# Patient Record
Sex: Male | Born: 1990 | Race: Black or African American | Hispanic: No | Marital: Single | Smoking: Former smoker
Health system: Southern US, Community
[De-identification: ages and names within clinical notes are randomized; demographics above are authoritative.]

## PROBLEM LIST (undated history)

## (undated) DIAGNOSIS — K219 Gastro-esophageal reflux disease without esophagitis: Secondary | ICD-10-CM

## (undated) HISTORY — PX: ESOPHAGOGASTRODUODENOSCOPY: SHX1529

---

## 2016-08-14 DIAGNOSIS — Z7251 High risk heterosexual behavior: Secondary | ICD-10-CM | POA: Insufficient documentation

## 2016-09-17 DIAGNOSIS — M25519 Pain in unspecified shoulder: Secondary | ICD-10-CM | POA: Insufficient documentation

## 2018-12-04 ENCOUNTER — Encounter (HOSPITAL_COMMUNITY): Payer: Self-pay | Admitting: Emergency Medicine

## 2018-12-04 ENCOUNTER — Emergency Department (HOSPITAL_COMMUNITY)
Admission: EM | Admit: 2018-12-04 | Discharge: 2018-12-04 | Disposition: A | Discharge status: Home / Self Care | Payer: Self-pay | Attending: Emergency Medicine | Admitting: Emergency Medicine

## 2018-12-04 ENCOUNTER — Emergency Department (HOSPITAL_COMMUNITY): Payer: Self-pay

## 2018-12-04 ENCOUNTER — Other Ambulatory Visit: Payer: Self-pay

## 2018-12-04 DIAGNOSIS — R0789 Other chest pain: Secondary | ICD-10-CM | POA: Insufficient documentation

## 2018-12-04 DIAGNOSIS — R0781 Pleurodynia: Secondary | ICD-10-CM

## 2018-12-04 DIAGNOSIS — F17210 Nicotine dependence, cigarettes, uncomplicated: Secondary | ICD-10-CM | POA: Insufficient documentation

## 2018-12-04 IMAGING — CR DG CHEST 2V
2 series · 2 of 2 positions shown · non-contrast
Comparison: None.

CLINICAL DATA: Left mid/lower rib pain for 3 days.

EXAM:
CHEST - 2 VIEW

[chest pa]
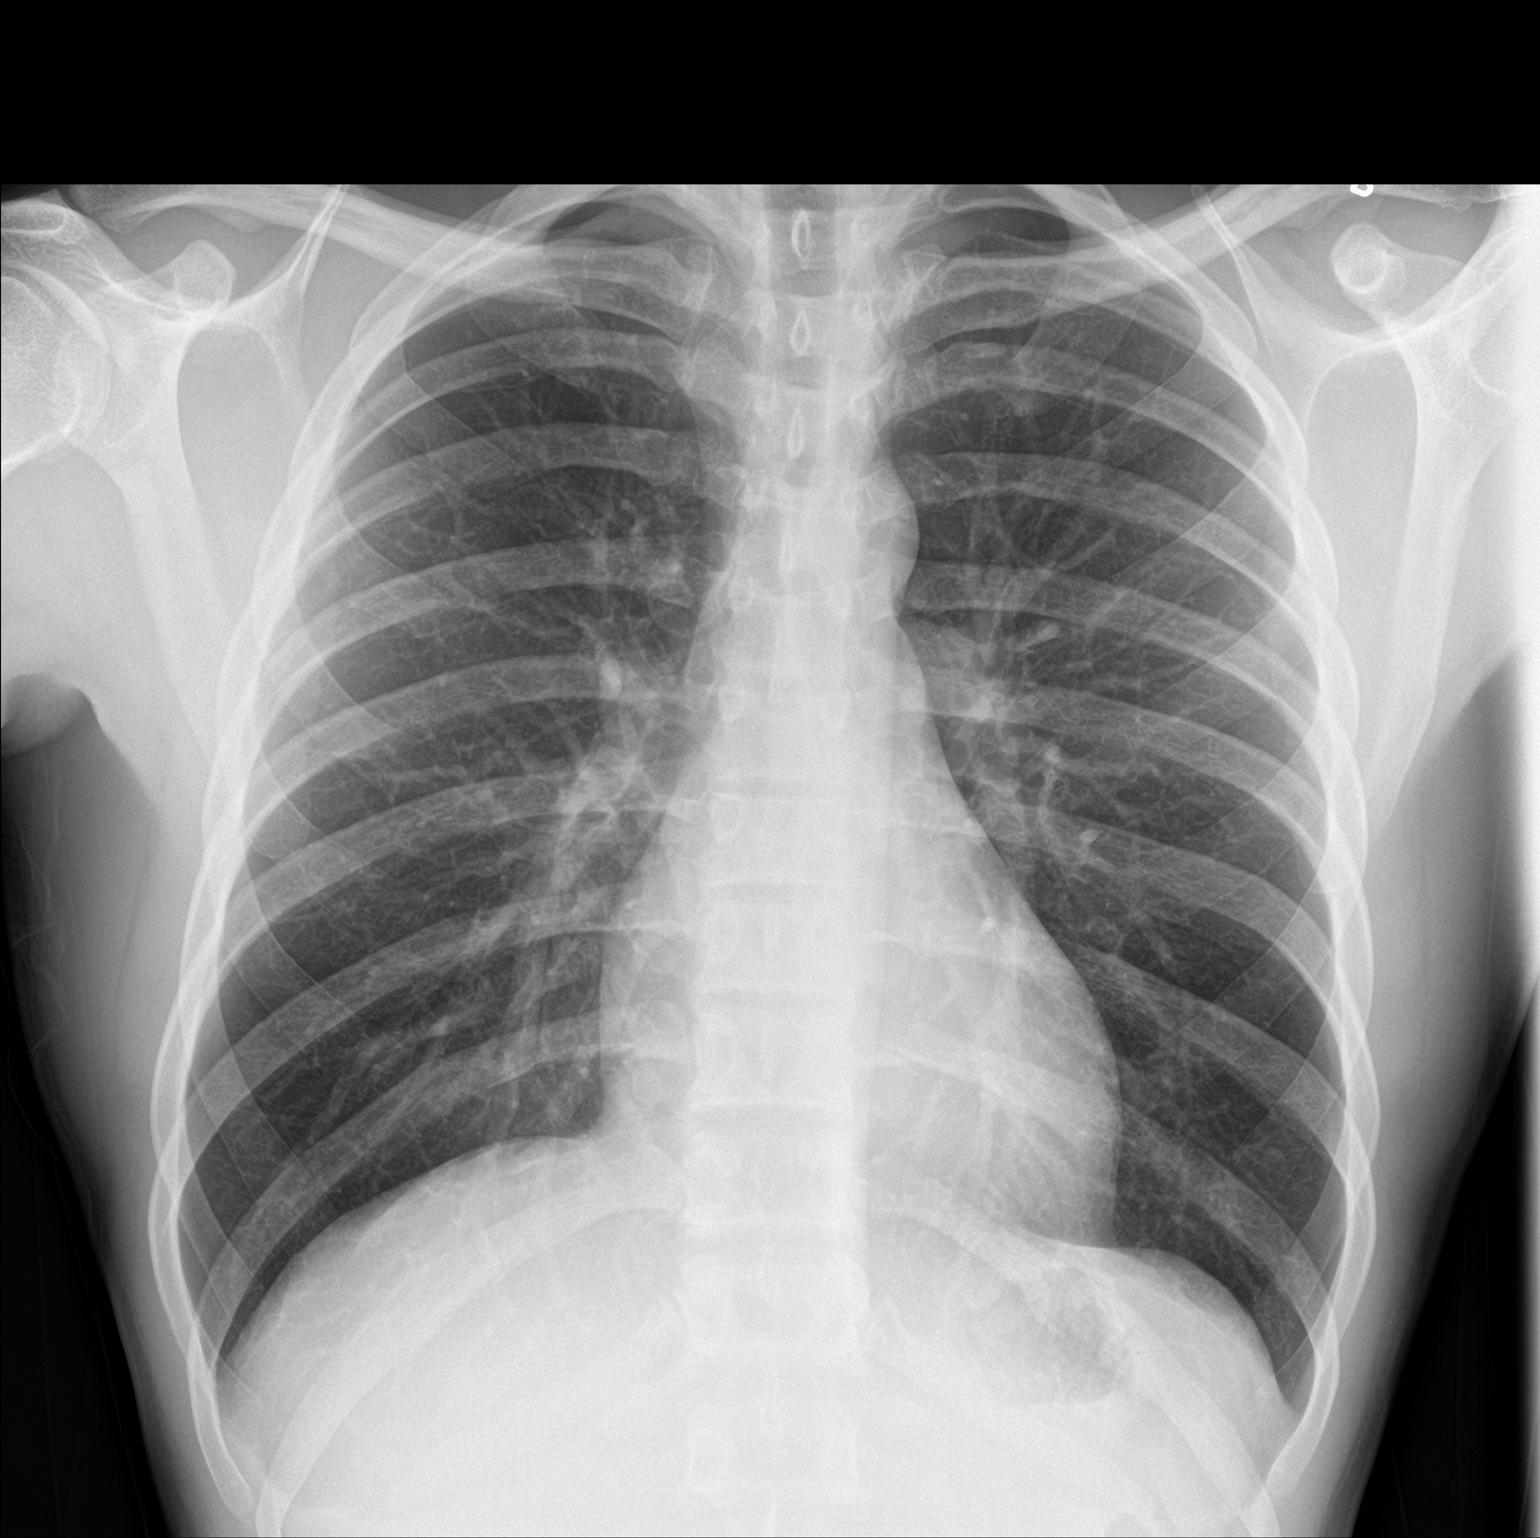

[chest lat]
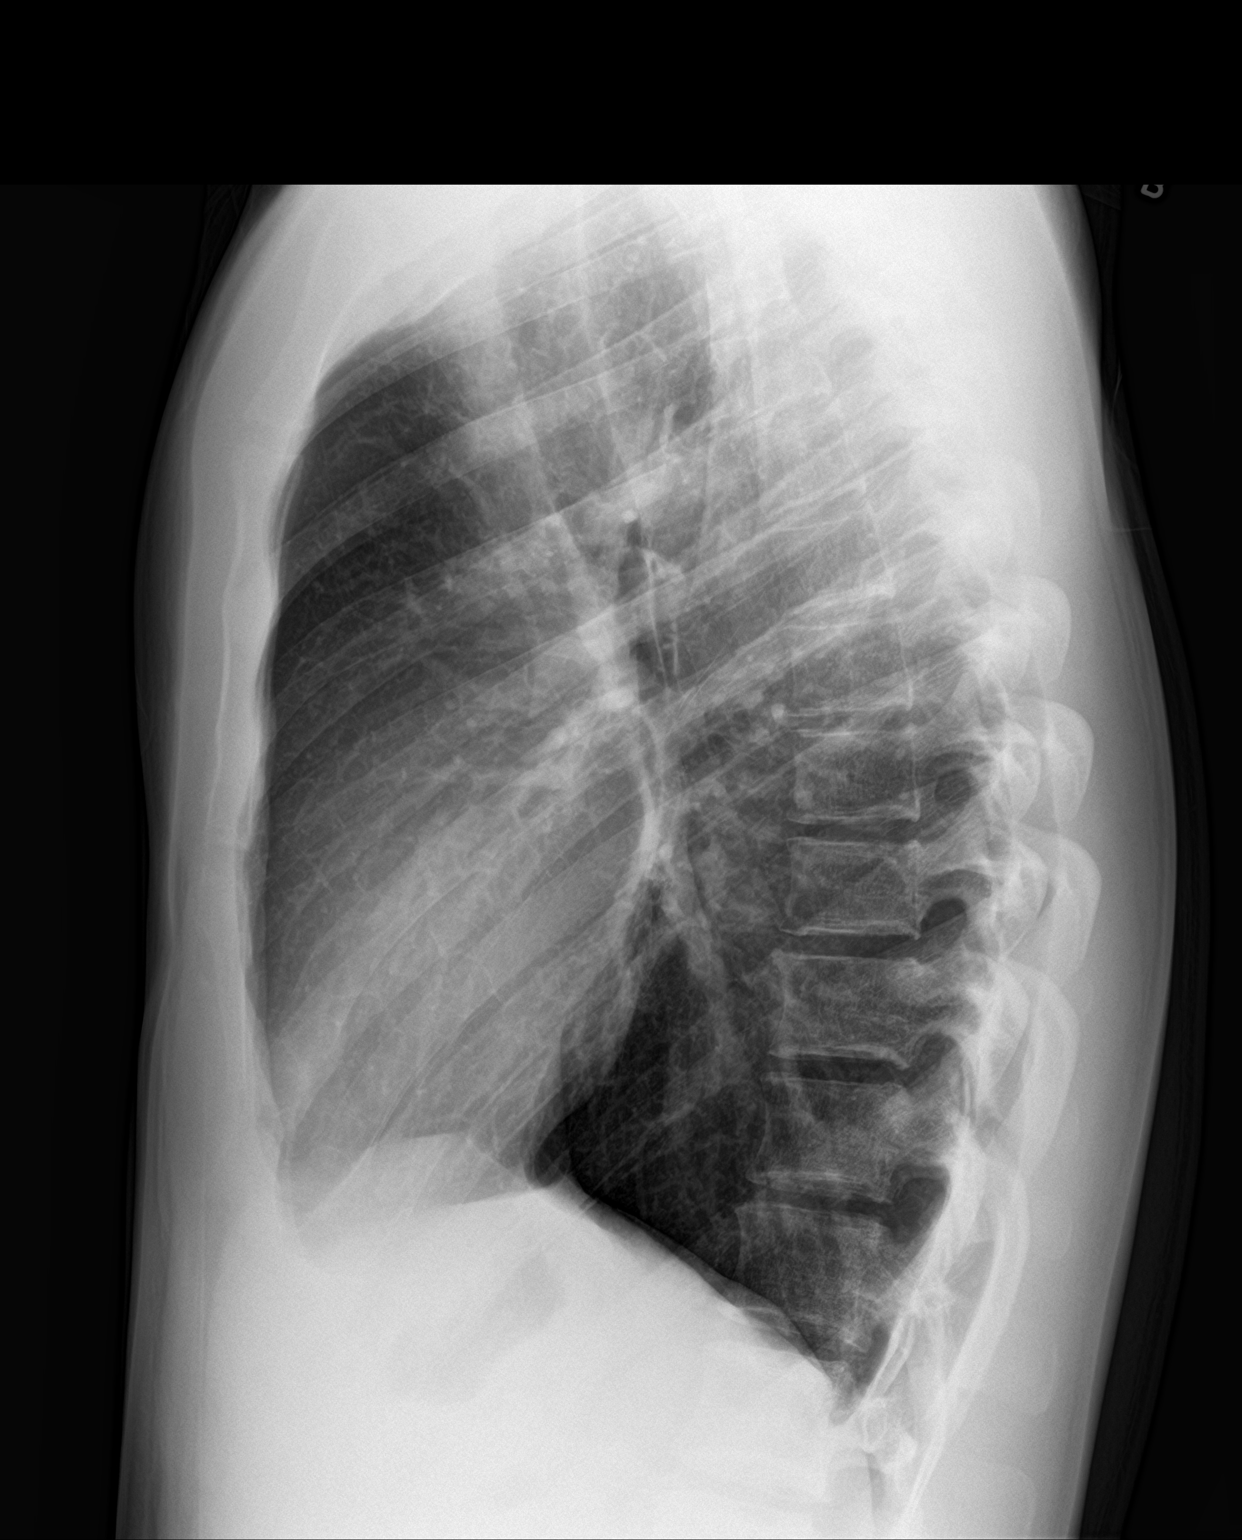

[2 of 2 positions shown; findings below may reference images not displayed]

FINDINGS: The cardiomediastinal silhouette is within normal limits. The lungs
are well inflated and clear. There is no evidence of pleural
effusion or pneumothorax. No acute osseous abnormality is
identified.
IMPRESSION: No active cardiopulmonary disease.

## 2018-12-04 NOTE — ED Notes (Signed)
Patient transported to X-ray 

## 2018-12-04 NOTE — ED Triage Notes (Signed)
Pt reports lower rib cage pain on the left side. Pt reports same hurts when he breathes or touches same.

## 2018-12-04 NOTE — Discharge Instructions (Signed)
You may take over-the-counter Tylenol or Motrin.  You can apply gentle heat or ice intermittantly. Thank you for allowing me to care for you today. Please return to the emergency department if you have new or worsening symptoms. Take your medications as instructed.

## 2018-12-04 NOTE — ED Notes (Signed)
Pt A&ox4, ambulatory at d/c with independent steady gait, NAD. Verbalized understanding of d/c instructions and follow up care

## 2018-12-04 NOTE — ED Provider Notes (Signed)
[facility] EMERGENCY DEPARTMENT Provider Note   CSN: [id number] Arrival date & time: 12/04/18  4287     History   Chief Complaint Chief Complaint  Patient presents with  . Rib Cage Pain    HPI Vincenzo Stave is a 28 y.o. male.     Patient is a 28 year old male with no past medical history presenting to the emergency department for rib cage pain.  He reports "it hurts in my ribs like I might have a fracture or broke a rib or something".  Reports that this started about 3 days ago.  Denies any injury or trauma.  No history of the same in the past.  Pain is worse with palpation and with coughing.  Denies any shortness of breath, fever, chills, chest pain     History reviewed. No pertinent past medical history.  There are no active problems to display for this patient.   History reviewed. No pertinent surgical history.      Home Medications    Prior to Admission medications   Not on File    Family History No family history on file.  Social History Social History   Tobacco Use  . Smoking status: Current Some Day Smoker  . Smokeless tobacco: Never Used  Substance Use Topics  . Alcohol use: Never    Frequency: Never  . Drug use: Never     Allergies   Patient has no known allergies.   Review of Systems Review of Systems  Constitutional: Negative for chills and fever.  HENT: Negative for congestion, rhinorrhea and sore throat.   Respiratory: Negative for cough and shortness of breath.   Cardiovascular: Negative for chest pain.  Gastrointestinal: Negative for abdominal distention, abdominal pain, nausea and vomiting.  Genitourinary: Negative for flank pain.  Musculoskeletal: Positive for arthralgias. Negative for myalgias.  Skin: Negative for rash and wound.  Neurological: Negative for dizziness, light-headedness and numbness.     Physical Exam Updated Vital Signs BP 129/82 (BP Location: Right Arm)   Pulse (!) 52   Temp 98.4 F  (36.9 C) (Oral)   Resp 18   Ht 6\' 3"  (1.905 m)   Wt 79.4 kg   SpO2 98%   BMI 21.87 kg/m   Physical Exam Vitals signs and nursing note reviewed.  Constitutional:      Appearance: Normal appearance. He is well-developed.  HENT:     Head: Normocephalic and atraumatic.  Eyes:     Conjunctiva/sclera: Conjunctivae normal.  Neck:     Musculoskeletal: Neck supple.  Cardiovascular:     Rate and Rhythm: Normal rate and regular rhythm.     Heart sounds: No murmur.  Pulmonary:     Effort: Pulmonary effort is normal. No respiratory distress.     Breath sounds: Normal breath sounds.  Chest:    Abdominal:     Palpations: Abdomen is soft.     Tenderness: There is no abdominal tenderness.  Skin:    General: Skin is warm and dry.  Neurological:     Mental Status: He is alert.      ED Treatments / Results  Labs (all labs ordered are listed, but only abnormal results are displayed) Labs Reviewed - No data to display  EKG None  Radiology Dg Chest 2 View  Result Date: 12/04/2018 CLINICAL DATA:  Left mid/lower rib pain for 3 days. EXAM: CHEST - 2 VIEW COMPARISON:  None. FINDINGS: The cardiomediastinal silhouette is within normal limits. The lungs are well inflated and  clear. There is no evidence of pleural effusion or pneumothorax. No acute osseous abnormality is identified. IMPRESSION: No active cardiopulmonary disease. Electronically Signed   By: Logan Bores M.D.   On: 12/04/2018 10:42    Procedures Procedures (including critical care time)  Medications Ordered in ED Medications - No data to display   Initial Impression / Assessment and Plan / ED Course  I have reviewed the triage vital signs and the nursing notes.  Pertinent labs & imaging results that were available during my care of the patient were reviewed by me and considered in my medical decision making (see chart for details).  Clinical Course as of Dec 03 1049  Fri Dec 04, 2018  1049 Patient with  musculoskeletal tenderness to the anterior rib cage.  Chest x-ray normal.  Likely musculoskeletal strain.  Patient declined any pain medication.   [KM]    Clinical Course User Index [KM] Alveria Apley, PA-C       Based on review of vitals, medical screening exam, lab work and/or imaging, there does not appear to be an acute, emergent etiology for the patient's symptoms. Counseled pt on good return precautions and encouraged both PCP and ED follow-up as needed.  Prior to discharge, I also discussed incidental imaging findings with patient in detail and advised appropriate, recommended follow-up in detail.  Clinical Impression: 1. Rib pain     Disposition: Discharge  Prior to providing a prescription for a controlled substance, I independently reviewed the patient's recent prescription history on the [place]. The patient had no recent or regular prescriptions and was deemed appropriate for a brief, less than 3 day prescription of narcotic for acute analgesia.  This note was prepared with assistance of Systems analyst. Occasional wrong-word or sound-a-like substitutions may have occurred due to the inherent limitations of voice recognition software.   Final Clinical Impressions(s) / ED Diagnoses   Final diagnoses:  Rib pain    ED Discharge Orders    None       Kristine Royal 12/04/18 1051    Virgel Manifold, MD 12/05/18 1216

## 2019-03-26 ENCOUNTER — Other Ambulatory Visit: Payer: Self-pay

## 2019-03-26 ENCOUNTER — Encounter (HOSPITAL_COMMUNITY): Payer: Self-pay | Admitting: *Deleted

## 2019-03-26 ENCOUNTER — Emergency Department (HOSPITAL_COMMUNITY)
Admission: EM | Admit: 2019-03-26 | Discharge: 2019-03-26 | Disposition: A | Discharge status: Home / Self Care | Payer: Self-pay | Attending: Emergency Medicine | Admitting: Emergency Medicine

## 2019-03-26 DIAGNOSIS — R112 Nausea with vomiting, unspecified: Secondary | ICD-10-CM | POA: Insufficient documentation

## 2019-03-26 DIAGNOSIS — Z87891 Personal history of nicotine dependence: Secondary | ICD-10-CM | POA: Insufficient documentation

## 2019-03-26 DIAGNOSIS — T407X1A Poisoning by cannabis (derivatives), accidental (unintentional), initial encounter: Secondary | ICD-10-CM | POA: Insufficient documentation

## 2019-03-26 LAB — COMPREHENSIVE METABOLIC PANEL
ALT: 25 U/L (ref 0–44)
AST: 23 U/L (ref 15–41)
Albumin: 5 g/dL (ref 3.5–5.0)
Alkaline Phosphatase: 50 U/L (ref 38–126)
Anion gap: 12 (ref 5–15)
BUN: 8 mg/dL (ref 6–20)
CO2: 25 mmol/L (ref 22–32)
Calcium: 10.3 mg/dL (ref 8.9–10.3)
Chloride: 104 mmol/L (ref 98–111)
Creatinine, Ser: 0.99 mg/dL (ref 0.61–1.24)
GFR calc Af Amer: >60 mL/min (ref >60)
GFR calc non Af Amer: >60 mL/min (ref >60)
Glucose, Bld: 104 mg/dL — ABNORMAL HIGH (ref 70–99)
Potassium: 3.8 mmol/L (ref 3.5–5.1)
Sodium: 141 mmol/L (ref 135–145)
Total Bilirubin: 1.4 mg/dL — ABNORMAL HIGH (ref 0.3–1.2)
Total Protein: 7.9 g/dL (ref 6.5–8.1)

## 2019-03-26 LAB — CBC
HCT: 45.5 % (ref 39.0–52.0)
Hemoglobin: 15.2 g/dL (ref 13.0–17.0)
MCH: 30.3 pg (ref 26.0–34.0)
MCHC: 33.4 g/dL (ref 30.0–36.0)
MCV: 90.6 fL (ref 80.0–100.0)
Platelets: 273 10*3/uL (ref 150–400)
RBC: 5.02 MIL/uL (ref 4.22–5.81)
RDW: 13.6 % (ref 11.5–15.5)
WBC: 10.5 10*3/uL (ref 4.0–10.5)
nRBC: 0 % (ref 0.0–0.2)

## 2019-03-26 LAB — URINALYSIS, ROUTINE W REFLEX MICROSCOPIC
Bilirubin Urine: NEGATIVE
Glucose, UA: NEGATIVE mg/dL
Hgb urine dipstick: NEGATIVE
Ketones, ur: NEGATIVE mg/dL
Leukocytes,Ua: NEGATIVE
Nitrite: NEGATIVE
Protein, ur: NEGATIVE mg/dL
Specific Gravity, Urine: 1.017 (ref 1.005–1.030)
pH: 9 — ABNORMAL HIGH (ref 5.0–8.0)

## 2019-03-26 LAB — LIPASE, BLOOD: Lipase: 21 U/L (ref 11–51)

## 2019-03-26 MED ORDER — SODIUM CHLORIDE 0.9% FLUSH: 3.0000 mL | Freq: Once | INTRAVENOUS | Status: DC

## 2019-03-26 MED ORDER — ONDANSETRON HCL 4 MG/2ML IJ SOLN
4.0000 mg | Freq: Once | INTRAMUSCULAR | Status: AC
  Administered 2019-03-26: 4 mg via INTRAVENOUS
  Filled 2019-03-26: qty 2

## 2019-03-26 MED ORDER — ONDANSETRON 4 MG PO TBDP: 4.0000 mg | ORAL_TABLET | Freq: Three times a day (TID) | ORAL | 0 refills | Status: DC | PRN

## 2019-03-26 NOTE — ED Provider Notes (Signed)
This patient is a pleasant 29 year old male who is otherwise healthy, endorses using marijuana daily states that he only smokes in the mornings, he also reports that he has had nausea and abdominal discomfort almost every morning for the last 2 weeks.  Occasionally complains of some dark black stools but this is not regularly and denies any significant alcohol or anti-inflammatory use.  He denies having any dysuria, hematuria coughing or chest pain but sometimes states that when he gets nauseated his chest goes numb.  On my exam he has a soft nontender abdomen, benign exam, well-appearing, normal vital signs.  Labs ordered however I suspect hyperemesis from cannabinoid use, patient informed that he needs to stop using this and is agreeable.  No indication for advanced imaging at this time.  Medical screening examination/treatment/procedure(s) were conducted as a shared visit with non-physician practitioner(s) and myself.  I personally evaluated the patient during the encounter.  Clinical Impression:   Final diagnoses:  Cannabinoid hyperemesis syndrome         Eber Hong, MD 03/26/19 1932

## 2019-03-26 NOTE — ED Provider Notes (Signed)
[place] EMERGENCY DEPARTMENT Provider Note   CSN: [id number] Arrival date & time: 03/26/19  0813     History Chief Complaint  Patient presents with  . Abdominal Pain    Justin Ferrell is a 29 y.o. male who presents with nausea and vomiting.  Patient states that for the past 10 days he has had recurrent nausea and vomiting with abdominal discomfort early in the morning.  He states that he will vomit 4-5 times which will be bilious in nature.  He will also have a bowel movement in the morning but states that it is not diarrhea although will be dark at times.  He denies severe abdominal pain but describes some periumbilical abdominal discomfort.  Sometimes he feels like his abdominal discomfort radiates up to his chest and it will cause chest numbness and hand numbness.  Symptoms improved throughout the day until the next morning. They also improve with hot showers.  He denies fever, chills, shortness of breath, cough, bloody stools, urinary symptoms.  He denies significant alcohol or NSAID use.  He does endorse daily marijuana use for years. No prior abdominal surgeries.  HPI     History reviewed. No pertinent past medical history.  There are no problems to display for this patient.   History reviewed. No pertinent surgical history.     No family history on file.  Social History   Tobacco Use  . Smoking status: Former Research scientist (life sciences)  . Smokeless tobacco: Never Used  Substance Use Topics  . Alcohol use: Never  . Drug use: Yes    Types: Marijuana    Home Medications Prior to Admission medications   Not on File    Allergies    Patient has no known allergies.  Review of Systems   Review of Systems  Constitutional: Negative for chills and fever.  Respiratory: Negative for cough and shortness of breath.   Cardiovascular: Negative for chest pain.  Gastrointestinal: Positive for abdominal pain, nausea and vomiting. Negative for blood in stool and diarrhea.    Genitourinary: Negative for difficulty urinating, dysuria, frequency and hematuria.  Neurological: Positive for numbness.  All other systems reviewed and are negative.   Physical Exam Updated Vital Signs BP (!) 143/84   Pulse (!) 57   Temp 97.7 F (36.5 C) (Oral)   Resp 18   Ht 6\' 3"  (1.905 m)   Wt 74.8 kg   SpO2 100%   BMI 20.62 kg/m   Physical Exam Vitals and nursing note reviewed.  Constitutional:      General: He is not in acute distress.    Appearance: He is well-developed. He is not ill-appearing.     Comments: Calm, cooperative. NAD. Fatigued but overall well appearing  HENT:     Head: Normocephalic and atraumatic.  Eyes:     General: No scleral icterus.       Right eye: No discharge.        Left eye: No discharge.     Conjunctiva/sclera: Conjunctivae normal.     Pupils: Pupils are equal, round, and reactive to light.  Cardiovascular:     Rate and Rhythm: Regular rhythm. Bradycardia present.  Pulmonary:     Effort: Pulmonary effort is normal. No respiratory distress.     Breath sounds: Normal breath sounds.  Abdominal:     General: Abdomen is flat. Bowel sounds are normal. There is no distension.     Palpations: Abdomen is soft.     Tenderness: There is no abdominal  tenderness.  Musculoskeletal:     Cervical back: Normal range of motion.  Skin:    General: Skin is warm and dry.  Neurological:     Mental Status: He is alert and oriented to person, place, and time.  Psychiatric:        Behavior: Behavior normal.     ED Results / Procedures / Treatments   Labs (all labs ordered are listed, but only abnormal results are displayed) Labs Reviewed  COMPREHENSIVE METABOLIC PANEL - Abnormal; Notable for the following components:      Result Value   Glucose, Bld 104 (*)    Total Bilirubin 1.4 (*)    All other components within normal limits  URINALYSIS, ROUTINE W REFLEX MICROSCOPIC - Abnormal; Notable for the following components:   pH 9.0 (*)    All  other components within normal limits  LIPASE, BLOOD  CBC    EKG None  Radiology No results found.  Procedures Procedures (including critical care time)  Medications Ordered in ED Medications  sodium chloride flush (NS) 0.9 % injection 3 mL (has no administration in time range)  ondansetron (ZOFRAN) injection 4 mg (4 mg Intravenous Given 03/26/19 0258)    ED Course  I have reviewed the triage vital signs and the nursing notes.  Pertinent labs & imaging results that were available during my care of the patient were reviewed by me and considered in my medical decision making (see chart for details).  29 year old male with periumbilical abdominal discomfort and recurrent nausea and vomiting every morning for the past 10 days.  He endorses regular marijuana use.  He is bradycardic but otherwise vitals are reassuring.  Abdomen is soft and nontender.  High suspicion for cannabinoid hyperemesis syndrome.  Patient was counseled on marijuana cessation and verbalizes understanding.  Will obtain labs, UA.  Shared visit with Dr. Hyacinth Meeker.  CBC is normal.  CMP is overall unremarkable.  Bilirubin is minimally elevated to 1.48.  He has no abdominal tenderness and there is low suspicion for biliary pathology.  UA is reassuring.  Discussed results with the patient.  He feels a little bit better after Zofran but not completely.  Will attempt p.o. challenge.  Pt vomited after PO challenge but he is requesting d/c. Will rx Zofran. Pt was strongly encouraged to abstain from marijuana use.  MDM Rules/Calculators/A&P                       Final Clinical Impression(s) / ED Diagnoses Final diagnoses:  Cannabinoid hyperemesis syndrome    Rx / DC Orders ED Discharge Orders    None       Bethel Born, PA-C 03/26/19 1148    Eber Hong, MD 03/26/19 [id number]

## 2019-03-26 NOTE — Discharge Instructions (Signed)
Take Zofran as needed for nausea/vomiting Please avoid marijuana use as this will make your symptoms flare up again Return if you are worsening

## 2019-03-26 NOTE — ED Notes (Signed)
Patient Alert and oriented to baseline. Stable and ambulatory to baseline. Patient verbalized understanding of the discharge instructions.  Patient belongings were taken by the patient.   

## 2019-03-26 NOTE — ED Triage Notes (Signed)
C/o waking up with abd. Pain and vomits x 2 weeks

## 2019-03-26 NOTE — ED Notes (Signed)
Heat pack provided.  

## 2019-03-26 NOTE — ED Notes (Signed)
Pt provided with crackers and gingerale

## 2019-06-20 ENCOUNTER — Other Ambulatory Visit: Payer: Self-pay

## 2019-06-20 ENCOUNTER — Encounter (HOSPITAL_BASED_OUTPATIENT_CLINIC_OR_DEPARTMENT_OTHER): Payer: Self-pay | Admitting: *Deleted

## 2019-06-20 ENCOUNTER — Emergency Department (HOSPITAL_COMMUNITY)
Admission: EM | Admit: 2019-06-20 | Discharge: 2019-06-20 | Disposition: A | Discharge status: Home / Self Care | Payer: Self-pay | Attending: Emergency Medicine | Admitting: Emergency Medicine

## 2019-06-20 ENCOUNTER — Emergency Department (HOSPITAL_BASED_OUTPATIENT_CLINIC_OR_DEPARTMENT_OTHER)
Admission: EM | Admit: 2019-06-20 | Discharge: 2019-06-20 | Disposition: A | Discharge status: Home / Self Care | Payer: Self-pay | Attending: Emergency Medicine | Admitting: Emergency Medicine

## 2019-06-20 ENCOUNTER — Encounter (HOSPITAL_COMMUNITY): Payer: Self-pay | Admitting: Emergency Medicine

## 2019-06-20 DIAGNOSIS — Z87891 Personal history of nicotine dependence: Secondary | ICD-10-CM | POA: Insufficient documentation

## 2019-06-20 DIAGNOSIS — Z79899 Other long term (current) drug therapy: Secondary | ICD-10-CM | POA: Insufficient documentation

## 2019-06-20 DIAGNOSIS — R112 Nausea with vomiting, unspecified: Secondary | ICD-10-CM | POA: Insufficient documentation

## 2019-06-20 DIAGNOSIS — R1084 Generalized abdominal pain: Secondary | ICD-10-CM | POA: Insufficient documentation

## 2019-06-20 DIAGNOSIS — R109 Unspecified abdominal pain: Secondary | ICD-10-CM | POA: Insufficient documentation

## 2019-06-20 DIAGNOSIS — Z5321 Procedure and treatment not carried out due to patient leaving prior to being seen by health care provider: Secondary | ICD-10-CM | POA: Insufficient documentation

## 2019-06-20 LAB — URINALYSIS, MICROSCOPIC (REFLEX): WBC, UA: NONE SEEN WBC/hpf (ref 0–5)

## 2019-06-20 LAB — LIPASE, BLOOD: Lipase: 21 U/L (ref 11–51)

## 2019-06-20 LAB — URINALYSIS, ROUTINE W REFLEX MICROSCOPIC
Bilirubin Urine: NEGATIVE
Glucose, UA: NEGATIVE mg/dL
Hgb urine dipstick: NEGATIVE
Ketones, ur: >80 mg/dL — AB
Leukocytes,Ua: NEGATIVE
Nitrite: NEGATIVE
Protein, ur: 30 mg/dL — AB
Specific Gravity, Urine: 1.02 (ref 1.005–1.030)
pH: >9 — ABNORMAL HIGH (ref 5.0–8.0)

## 2019-06-20 LAB — CBC
HCT: 45.3 % (ref 39.0–52.0)
Hemoglobin: 16.2 g/dL (ref 13.0–17.0)
MCH: 30.4 pg (ref 26.0–34.0)
MCHC: 35.8 g/dL (ref 30.0–36.0)
MCV: 85 fL (ref 80.0–100.0)
Platelets: 297 10*3/uL (ref 150–400)
RBC: 5.33 MIL/uL (ref 4.22–5.81)
RDW: 13.3 % (ref 11.5–15.5)
WBC: 13.9 10*3/uL — ABNORMAL HIGH (ref 4.0–10.5)
nRBC: 0 % (ref 0.0–0.2)

## 2019-06-20 LAB — COMPREHENSIVE METABOLIC PANEL
ALT: 25 U/L (ref 0–44)
AST: 32 U/L (ref 15–41)
Albumin: 5.4 g/dL — ABNORMAL HIGH (ref 3.5–5.0)
Alkaline Phosphatase: 50 U/L (ref 38–126)
Anion gap: 14 (ref 5–15)
BUN: 19 mg/dL (ref 6–20)
CO2: 19 mmol/L — ABNORMAL LOW (ref 22–32)
Calcium: 10.4 mg/dL — ABNORMAL HIGH (ref 8.9–10.3)
Chloride: 104 mmol/L (ref 98–111)
Creatinine, Ser: 0.91 mg/dL (ref 0.61–1.24)
GFR calc Af Amer: >60 mL/min (ref >60)
GFR calc non Af Amer: >60 mL/min (ref >60)
Glucose, Bld: 112 mg/dL — ABNORMAL HIGH (ref 70–99)
Potassium: 3.7 mmol/L (ref 3.5–5.1)
Sodium: 137 mmol/L (ref 135–145)
Total Bilirubin: 2.4 mg/dL — ABNORMAL HIGH (ref 0.3–1.2)
Total Protein: 8.6 g/dL — ABNORMAL HIGH (ref 6.5–8.1)

## 2019-06-20 MED ORDER — HALOPERIDOL LACTATE 5 MG/ML IJ SOLN
2.0000 mg | Freq: Once | INTRAMUSCULAR | Status: AC
  Administered 2019-06-20: 19:00:00 2 mg via INTRAVENOUS
  Filled 2019-06-20: qty 1

## 2019-06-20 MED ORDER — ONDANSETRON 4 MG PO TBDP
4.0000 mg | ORAL_TABLET | Freq: Once | ORAL | Status: AC | PRN
  Administered 2019-06-20: 4 mg via ORAL
  Filled 2019-06-20: qty 1

## 2019-06-20 MED ORDER — SUCRALFATE 1 GM/10ML PO SUSP
1.0000 g | Freq: Three times a day (TID) | ORAL | 0 refills | Status: DC
Start: 2019-06-20 — End: 2019-09-23

## 2019-06-20 MED ORDER — SODIUM CHLORIDE 0.9% FLUSH: 3.0000 mL | Freq: Once | INTRAVENOUS | Status: DC

## 2019-06-20 MED ORDER — SODIUM CHLORIDE 0.9 % IV BOLUS
1000.0000 mL | Freq: Once | INTRAVENOUS | Status: AC
  Administered 2019-06-20: 1000 mL via INTRAVENOUS

## 2019-06-20 NOTE — ED Notes (Signed)
No answer from lobby  

## 2019-06-20 NOTE — ED Triage Notes (Signed)
Pt reports abd pains with n/v x 3 days. Reports had same problem couple months ago and unsure what caused. It.

## 2019-06-20 NOTE — Discharge Instructions (Signed)
Take Carafate as directed.  Make sure you are staying hydrated drink plenty fluids.  Return to the Emergency Department immediately if you experience any worsening abdominal pain, fever, persistent nausea and vomiting, inability keep any food down, pain with urination, blood in your urine or any other worsening or concerning symptoms.

## 2019-06-20 NOTE — ED Notes (Signed)
Pt called out and reported burning with urination when he was providing a specimen.

## 2019-06-20 NOTE — ED Notes (Signed)
While outside with another patient Justin Ferrell seen doing pushups in front of the building.

## 2019-06-20 NOTE — ED Triage Notes (Signed)
Pt c/o n/v and generalized abdominal pain "all week". States sx worse over the last 2 days. Reports emesis "over 10 times". Admits to smoking marijuana yesterday

## 2019-06-20 NOTE — ED Provider Notes (Signed)
[facility] EMERGENCY DEPARTMENT Provider Note   CSN: [id number] Arrival date & time: 06/20/19  1658     History Chief Complaint  Patient presents with  . Abdominal Pain    vomiting    Justin Ferrell is a 29 y.o. male presents for evaluation of generalized abdominal pain, nausea/vomiting that has been ongoing for the last 3 days.  Patient reports he has had multiple episodes of vomiting since onset of symptoms.  He states that it is yellow in nature.  Emesis is nonbloody, nonbilious.  He states that his whole abdomen hurts and is crampy and sharp at the same time.  Denies any diarrhea.  He has not had any fever.  Patient was initially at [facility] but states he waited too long so he came to the emergency department here.  He states he feels a reflux sensation in the upper part of his abdomen that is going into his chest.  He denies any fevers, difficulty breathing, dysuria, hematuria.  He denies any recent alcohol use.  He does report that he smoked marijuana yesterday.  No other drug use.  The history is provided by the patient.       History reviewed. No pertinent past medical history.  There are no problems to display for this patient.   History reviewed. No pertinent surgical history.     No family history on file.  Social History   Tobacco Use  . Smoking status: Former Smoker    Types: Cigars  . Smokeless tobacco: Never Used  Substance Use Topics  . Alcohol use: Never  . Drug use: Yes    Types: Marijuana    Home Medications Prior to Admission medications   Medication Sig Start Date End Date Taking? Authorizing Provider  omeprazole (PRILOSEC) 20 MG capsule Take 20 mg by mouth daily as needed (acid reflux).    [provider]  ondansetron (ZOFRAN ODT) 4 MG disintegrating tablet Take 1 tablet (4 mg total) by mouth every 8 (eight) hours as needed for nausea or vomiting. 03/26/19   Bethel Born, PA-C  sucralfate (CARAFATE) 1 GM/10ML  suspension Take 10 mLs (1 g total) by mouth 4 (four) times daily -  with meals and at bedtime. 06/20/19   Maxwell Caul, PA-C    Allergies    Patient has no known allergies.  Review of Systems   Review of Systems  Constitutional: Negative for fever.  Respiratory: Negative for cough and shortness of breath.   Cardiovascular: Negative for chest pain.  Gastrointestinal: Positive for abdominal pain, nausea and vomiting. Negative for diarrhea.  Genitourinary: Negative for dysuria and hematuria.  Neurological: Negative for headaches.  All other systems reviewed and are negative.   Physical Exam Updated Vital Signs BP 139/78 (BP Location: Right Arm)   Pulse 66   Temp 98.6 F (37 C) (Oral)   Resp 20   Ht 6\' 3"  (1.905 m)   Wt 79.4 kg   SpO2 100%   BMI 21.87 kg/m   Physical Exam Vitals and nursing note reviewed.  Constitutional:      Appearance: Normal appearance. He is well-developed.  HENT:     Head: Normocephalic and atraumatic.  Eyes:     General: Lids are normal.     Conjunctiva/sclera: Conjunctivae normal.     Pupils: Pupils are equal, round, and reactive to light.  Cardiovascular:     Rate and Rhythm: Normal rate and regular rhythm.     Pulses: Normal pulses.  Heart sounds: Normal heart sounds. No murmur. No friction rub. No gallop.   Pulmonary:     Effort: Pulmonary effort is normal.     Breath sounds: Normal breath sounds.     Comments: Lungs clear to auscultation bilaterally.  Symmetric chest rise.  No wheezing, rales, rhonchi. Abdominal:     Palpations: Abdomen is soft. Abdomen is not rigid.     Tenderness: There is generalized abdominal tenderness. There is no guarding.     Comments: Abdomen is soft, nondistended.  Generalized tenderness noted.  No rigidity, guarding.  No CVA tenderness noted bilaterally.  Musculoskeletal:        General: Normal range of motion.     Cervical back: Full passive range of motion without pain.  Skin:    General: Skin is  warm and dry.     Capillary Refill: Capillary refill takes less than 2 seconds.  Neurological:     Mental Status: He is alert and oriented to person, place, and time.  Psychiatric:        Speech: Speech normal.     ED Results / Procedures / Treatments   Labs (all labs ordered are listed, but only abnormal results are displayed) Labs Reviewed  COMPREHENSIVE METABOLIC PANEL - Abnormal; Notable for the following components:      Result Value   CO2 19 (*)    Glucose, Bld 112 (*)    Calcium 10.4 (*)    Total Protein 8.6 (*)    Albumin 5.4 (*)    Total Bilirubin 2.4 (*)    All other components within normal limits  CBC - Abnormal; Notable for the following components:   WBC 13.9 (*)    All other components within normal limits  URINALYSIS, ROUTINE W REFLEX MICROSCOPIC - Abnormal; Notable for the following components:   pH >9.0 (*)    Ketones, ur >80 (*)    Protein, ur 30 (*)    All other components within normal limits  URINALYSIS, MICROSCOPIC (REFLEX) - Abnormal; Notable for the following components:   Bacteria, UA RARE (*)    All other components within normal limits  LIPASE, BLOOD    EKG None  Radiology No results found.  Procedures Procedures (including critical care time)  Medications Ordered in ED Medications  ondansetron (ZOFRAN-ODT) disintegrating tablet 4 mg (4 mg Oral Given 06/20/19 1733)  sodium chloride 0.9 % bolus 1,000 mL (0 mLs Intravenous Stopped 06/20/19 2019)  haloperidol lactate (HALDOL) injection 2 mg (2 mg Intravenous Given 06/20/19 1859)    ED Course  I have reviewed the triage vital signs and the nursing notes.  Pertinent labs & imaging results that were available during my care of the patient were reviewed by me and considered in my medical decision making (see chart for details).    MDM Rules/Calculators/A&P                      29 year old male who presents for evaluation of abdominal pain, nausea/vomiting has been ongoing for last few  days.  Reports multiple episodes of nonbloody, nonbilious emesis.  No fever.  Does endorse smoking marijuana.  Initially arrival, he is afebrile, nontoxic-appearing.  Vital signs are stable.  He has generalized abdominal tenderness with no focal point.  Consider viral GI illness versus cannabinoid hyperemesis syndrome.  History/physical exam not concerning for appendicitis.  We will plan for labs, fluids, antiemetics.  UA shows ketones.  No evidence of infection etiology.  CMP shows normal BUN and  creatinine.  CBC shows slight leukocytosis.  Lipase unremarkable.  Patient states he is ready to go home.  He is sitting up in the chair and states he feels better.  He is hemodynamically stable.  No vomiting here in the ED.  Patient stable for discharge at this time. At this time, patient exhibits no emergent life-threatening condition that require further evaluation in ED or admission. Patient had ample opportunity for questions and discussion. All patient's questions were answered with full understanding. Strict return precautions discussed. Patient expresses understanding and agreement to plan.   Portions of this note were generated with Scientist, clinical (histocompatibility and immunogenetics). Dictation errors may occur despite best attempts at proofreading.  Final Clinical Impression(s) / ED Diagnoses Final diagnoses:  Generalized abdominal pain  Non-intractable vomiting with nausea, unspecified vomiting type    Rx / DC Orders ED Discharge Orders         Ordered    sucralfate (CARAFATE) 1 GM/10ML suspension  3 times daily with meals & bedtime     06/20/19 2012           Rosana Hoes 06/20/19 2301    Rolan Bucco, MD 06/20/19 2344

## 2019-06-20 NOTE — ED Triage Notes (Signed)
Arrives via EMS c/o reflux type abd pain with N/V x 3 days. Went to [facility] earlier today but left due to wait time.

## 2019-06-20 NOTE — ED Notes (Signed)
Pt discharged to home. Discharge instructions have been discussed with patient and/or family members. Pt verbally acknowledges understanding d/c instructions, and endorses comprehension to checkout at registration before leaving.  °

## 2019-09-23 ENCOUNTER — Encounter: Payer: Self-pay | Admitting: Physician Assistant

## 2019-09-23 ENCOUNTER — Ambulatory Visit
Admission: EM | Admit: 2019-09-23 | Discharge: 2019-09-23 | Disposition: A | Discharge status: Home / Self Care | Payer: 59 | Attending: Physician Assistant | Admitting: Physician Assistant

## 2019-09-23 ENCOUNTER — Other Ambulatory Visit: Payer: Self-pay

## 2019-09-23 DIAGNOSIS — R05 Cough: Secondary | ICD-10-CM | POA: Insufficient documentation

## 2019-09-23 DIAGNOSIS — J029 Acute pharyngitis, unspecified: Secondary | ICD-10-CM | POA: Diagnosis present

## 2019-09-23 DIAGNOSIS — R059 Cough, unspecified: Secondary | ICD-10-CM

## 2019-09-23 LAB — POCT RAPID STREP A (OFFICE): Rapid Strep A Screen: NEGATIVE

## 2019-09-23 MED ORDER — FLUTICASONE PROPIONATE 50 MCG/ACT NA SUSP
2.0000 | Freq: Every day | NASAL | 0 refills | Status: DC
Start: 2019-09-23 — End: 2020-02-07

## 2019-09-23 NOTE — ED Notes (Signed)
Patient able to ambulate independently  

## 2019-09-23 NOTE — ED Provider Notes (Signed)
[name]    CSN: [id number] Arrival date & time: 09/23/19  1447      History   Chief Complaint Chief Complaint  Patient presents with   URI    HPI Justin Ferrell is a 29 y.o. male.   29 year old male comes in for 1 day of URI symptoms. Has had cough, headache, chills, blood tinged sputum. Denies fever, chills, body aches. Denies abdominal pain, nausea, vomiting, diarrhea. Denies chest pain, shortness of breath, loss of taste/smell. Current some day smoker, 2 black and miles/ week. No medications for the symptoms.      History reviewed. No pertinent past medical history.  There are no problems to display for this patient.   History reviewed. No pertinent surgical history.     Home Medications    Prior to Admission medications   Medication Sig Start Date End Date Taking? Authorizing Provider  fluticasone (FLONASE) 50 MCG/ACT nasal spray Place 2 sprays into both nostrils daily. 09/23/19   Cathie Hoops, Nonna Renninger V, PA-C  omeprazole (PRILOSEC) 20 MG capsule Take 20 mg by mouth daily as needed (acid reflux).  09/23/19  [provider]  sucralfate (CARAFATE) 1 GM/10ML suspension Take 10 mLs (1 g total) by mouth 4 (four) times daily -  with meals and at bedtime. 06/20/19 09/23/19  Maxwell Caul, PA-C    Family History History reviewed. No pertinent family history.  Social History Social History   Tobacco Use   Smoking status: Former Smoker    Types: Cigars   Smokeless tobacco: Never Used  Building services engineer Use: Never used  Substance Use Topics   Alcohol use: Never   Drug use: Yes    Types: Marijuana     Allergies   Patient has no known allergies.   Review of Systems Review of Systems  Reason unable to perform ROS: See HPI as above.     Physical Exam Triage Vital Signs ED Triage Vitals  Enc Vitals Group     BP 09/23/19 1503 (!) 144/81     Pulse Rate 09/23/19 1503 59     Resp 09/23/19 1503 18     Temp 09/23/19 1503 98.2 F  (36.8 C)     Temp Source 09/23/19 1503 Oral     SpO2 09/23/19 1503 98 %     Weight --      Height --      Head Circumference --      Peak Flow --      Pain Score 09/23/19 1505 8     Pain Loc --      Pain Edu? --      Excl. in GC? --    No data found.  Updated Vital Signs BP (!) 144/81 (BP Location: Left Arm)    Pulse 59    Temp 98.2 F (36.8 C) (Oral)    Resp 18    SpO2 98%   Physical Exam Constitutional:      General: He is not in acute distress.    Appearance: Normal appearance. He is not ill-appearing, toxic-appearing or diaphoretic.  HENT:     Head: Normocephalic and atraumatic.     Mouth/Throat:     Mouth: Mucous membranes are moist.     Pharynx: Oropharynx is clear. Uvula midline. Posterior oropharyngeal erythema present.  Cardiovascular:     Rate and Rhythm: Normal rate and regular rhythm.     Heart sounds: Normal heart sounds. No murmur heard.  No friction rub.  No gallop.   Pulmonary:     Effort: Pulmonary effort is normal. No accessory muscle usage, prolonged expiration, respiratory distress or retractions.     Comments: Lungs clear to auscultation without adventitious lung sounds. Musculoskeletal:     Cervical back: Normal range of motion and neck supple.  Neurological:     General: No focal deficit present.     Mental Status: He is alert and oriented to person, place, and time.      UC Treatments / Results  Labs (all labs ordered are listed, but only abnormal results are displayed) Labs Reviewed  POCT RAPID STREP A (OFFICE) - Normal  CULTURE, GROUP A STREP ([facility])  NOVEL CORONAVIRUS, NAA    EKG   Radiology No results found.  Procedures Procedures (including critical care time)  Medications Ordered in UC Medications - No data to display  Initial Impression / Assessment and Plan / UC Course  I have reviewed the triage vital signs and the nursing notes.  Pertinent labs & imaging results that were available during my care of the patient were  reviewed by me and considered in my medical decision making (see chart for details).    Rapid strep negative. COVID PCR test ordered. Patient to quarantine until testing results return. Blood tinged sputum without hemoptysis. LCTAB without chest pain, shob. Symptomatic treatment discussed.  Push fluids.  Return precautions given.  Patient expresses understanding and agrees to plan.  Final Clinical Impressions(s) / UC Diagnoses   Final diagnoses:  Cough  Sore throat    ED Prescriptions    Medication Sig Dispense Auth. Provider   fluticasone (FLONASE) 50 MCG/ACT nasal spray Place 2 sprays into both nostrils daily. 1 g Belinda Fisher, PA-C     PDMP not reviewed this encounter.   Belinda Fisher, PA-C 09/23/19 1625
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
## 2019-09-23 NOTE — Discharge Instructions (Signed)
Rapid strep negative. COVID PCR testing ordered. I would like you to quarantine until testing results. You can take over the counter flonase/nasacort to help with nasal congestion/drainage. Tylenol/motrin for pain and fever. Keep hydrated, urine should be clear to pale yellow in color. If experiencing shortness of breath, trouble breathing, go to the emergency department for further evaluation needed.  

## 2019-09-24 LAB — NOVEL CORONAVIRUS, NAA: SARS-CoV-2, NAA: NOT DETECTED

## 2019-09-24 LAB — SARS-COV-2, NAA 2 DAY TAT

## 2019-09-26 LAB — CULTURE, GROUP A STREP (THRC)

## 2019-11-24 ENCOUNTER — Ambulatory Visit: Admission: EM | Admit: 2019-11-24 | Discharge: 2019-11-24 | Discharge status: Absent without leave | Payer: 59

## 2019-11-24 NOTE — ED Notes (Signed)
Called patient 3 times over the last 45-60 minutes with no response. Patient dismissed at this time.

## 2019-11-25 ENCOUNTER — Other Ambulatory Visit: Payer: Self-pay

## 2019-11-25 ENCOUNTER — Emergency Department (HOSPITAL_COMMUNITY)
Admission: EM | Admit: 2019-11-25 | Discharge: 2019-11-25 | Disposition: A | Discharge status: Home / Self Care | Payer: 59 | Attending: Emergency Medicine | Admitting: Emergency Medicine

## 2019-11-25 ENCOUNTER — Encounter (HOSPITAL_COMMUNITY): Payer: Self-pay | Admitting: *Deleted

## 2019-11-25 ENCOUNTER — Ambulatory Visit
Admission: EM | Admit: 2019-11-25 | Discharge: 2019-11-25 | Disposition: A | Discharge status: Home / Self Care | Payer: 59 | Attending: Emergency Medicine | Admitting: Emergency Medicine

## 2019-11-25 DIAGNOSIS — R1084 Generalized abdominal pain: Secondary | ICD-10-CM

## 2019-11-25 DIAGNOSIS — R109 Unspecified abdominal pain: Secondary | ICD-10-CM | POA: Insufficient documentation

## 2019-11-25 DIAGNOSIS — Z5321 Procedure and treatment not carried out due to patient leaving prior to being seen by health care provider: Secondary | ICD-10-CM | POA: Diagnosis not present

## 2019-11-25 DIAGNOSIS — R112 Nausea with vomiting, unspecified: Secondary | ICD-10-CM

## 2019-11-25 MED ORDER — ONDANSETRON 4 MG PO TBDP
4.0000 mg | ORAL_TABLET | Freq: Once | ORAL | Status: AC
  Administered 2019-11-25: 4 mg via ORAL

## 2019-11-25 MED ORDER — KETOROLAC TROMETHAMINE 30 MG/ML IJ SOLN
30.0000 mg | Freq: Once | INTRAMUSCULAR | Status: AC
  Administered 2019-11-25: 30 mg via INTRAMUSCULAR

## 2019-11-25 MED ORDER — ONDANSETRON 4 MG PO TBDP
4.0000 mg | ORAL_TABLET | Freq: Three times a day (TID) | ORAL | 0 refills | Status: DC | PRN
Start: 2019-11-25 — End: 2020-02-07

## 2019-11-25 NOTE — ED Triage Notes (Signed)
Pt states he began having abdominal pain yesterday morning and has not been able to keep food down due to vomiting episodes. Pt is aox4 and ambulatory.

## 2019-11-25 NOTE — ED Provider Notes (Signed)
[name]    CSN: [id number] Arrival date & time: 11/25/19  0806      History   Chief Complaint Chief Complaint  Patient presents with  . Abdominal Pain    Since yesterday morning  . Emesis    x 3 episodes    HPI Justin Ferrell is a 29 y.o. male  Presenting for generalized abdominal pain that began yesterday.  States has been constant, worsening.  Presented to this clinic yesterday, the left due to wait time.  Has not seek evaluation elsewhere.  Having nausea with vomiting.  No biliary or bloody emesis, projectile vomiting, hematochezia or melena.  Denies urinary symptoms, penile discharge or testicular pain, fever.  No cough, nasal congestion, known sick contacts.  Denies heavy alcohol intake.  States this is happened once remotely in the past: "They never found anything, just went away".  History reviewed. No pertinent past medical history.  There are no problems to display for this patient.   History reviewed. No pertinent surgical history.     Home Medications    Prior to Admission medications   Medication Sig Start Date End Date Taking? Authorizing Provider  fluticasone (FLONASE) 50 MCG/ACT nasal spray Place 2 sprays into both nostrils daily. 09/23/19   Cathie Hoops, Amy V, PA-C  ondansetron (ZOFRAN ODT) 4 MG disintegrating tablet Take 1 tablet (4 mg total) by mouth every 8 (eight) hours as needed for nausea or vomiting. 11/25/19   Hall-Potvin, [place], PA-C  omeprazole (PRILOSEC) 20 MG capsule Take 20 mg by mouth daily as needed (acid reflux).  09/23/19  [provider]  sucralfate (CARAFATE) 1 GM/10ML suspension Take 10 mLs (1 g total) by mouth 4 (four) times daily -  with meals and at bedtime. 06/20/19 09/23/19  Maxwell Caul, PA-C    Family History History reviewed. No pertinent family history.  Social History Social History   Tobacco Use  . Smoking status: Former Smoker    Types: Cigars  . Smokeless tobacco: Never Used  Vaping Use    . Vaping Use: Never used  Substance Use Topics  . Alcohol use: Never  . Drug use: Yes    Types: Marijuana     Allergies   Patient has no known allergies.   Review of Systems As per HPI   Physical Exam Triage Vital Signs ED Triage Vitals  Enc Vitals Group     BP 11/25/19 0817 132/83     Pulse Rate 11/25/19 0817 65     Resp 11/25/19 0817 (!) 21     Temp 11/25/19 0817 98.5 F (36.9 C)     Temp Source 11/25/19 0817 Oral     SpO2 11/25/19 0817 99 %     Weight --      Height --      Head Circumference --      Peak Flow --      Pain Score 11/25/19 0818 9     Pain Loc --      Pain Edu? --      Excl. in GC? --    No data found.  Updated Vital Signs BP 132/83 (BP Location: Left Arm)   Pulse 65   Temp 98.5 F (36.9 C) (Oral)   Resp (!) 21   SpO2 99%   Visual Acuity Right Eye Distance:   Left Eye Distance:   Bilateral Distance:    Right Eye Near:   Left Eye Near:    Bilateral Near:  Physical Exam Constitutional:      General: He is not in acute distress. HENT:     Head: Normocephalic and atraumatic.  Eyes:     General: No scleral icterus.    Pupils: Pupils are equal, round, and reactive to light.  Cardiovascular:     Rate and Rhythm: Normal rate.  Pulmonary:     Effort: Pulmonary effort is normal. No respiratory distress.     Breath sounds: No wheezing.  Abdominal:     General: Abdomen is flat. Bowel sounds are normal. There is no abdominal bruit.     Palpations: Abdomen is soft. There is no hepatomegaly or splenomegaly.     Tenderness: There is generalized abdominal tenderness. There is no right CVA tenderness or left CVA tenderness. Negative signs include Murphy's sign, Rovsing's sign and McBurney's sign.     Comments: Voluntary guarding.  Overall exam limited secondary patient cooperation due to pain.  Skin:    Coloration: Skin is not jaundiced or pale.  Neurological:     Mental Status: He is alert and oriented to person, place, and time.       UC Treatments / Results  Labs (all labs ordered are listed, but only abnormal results are displayed) Labs Reviewed - No data to display  EKG   Radiology No results found.  Procedures Procedures (including critical care time)  Medications Ordered in UC Medications  ondansetron (ZOFRAN-ODT) disintegrating tablet 4 mg (4 mg Oral Given 11/25/19 0837)  ketorolac (TORADOL) 30 MG/ML injection 30 mg (30 mg Intramuscular Given 11/25/19 0837)    Initial Impression / Assessment and Plan / UC Course  I have reviewed the triage vital signs and the nursing notes.  Pertinent labs & imaging results that were available during my care of the patient were reviewed by me and considered in my medical decision making (see chart for details).     Afebrile, nontoxic.  Patient without emesis in office.  Given Zofran, Toradol which he tolerated well.  ER return precautions discussed, pt verbalized understanding and is agreeable to plan. Final Clinical Impressions(s) / UC Diagnoses   Final diagnoses:  Generalized abdominal pain  Non-intractable vomiting with nausea, unspecified vomiting type     Discharge Instructions     You were given pain shot today. Take zofran under tongue as needed for nausea. Go to ER for worsening pain, vomiting, blood in your stool, fever.    ED Prescriptions    Medication Sig Dispense Auth. Provider   ondansetron (ZOFRAN ODT) 4 MG disintegrating tablet Take 1 tablet (4 mg total) by mouth every 8 (eight) hours as needed for nausea or vomiting. 21 tablet Hall-Potvin, [place], PA-C     PDMP not reviewed this encounter.   Hall-Potvin, [place], [place] 11/25/19 [phone]

## 2019-11-25 NOTE — Discharge Instructions (Addendum)
You were given pain shot today. Take zofran under tongue as needed for nausea. Go to ER for worsening pain, vomiting, blood in your stool, fever.

## 2019-11-25 NOTE — ED Notes (Signed)
Pt left AMA. Pt seen walking out of triage.

## 2019-11-25 NOTE — ED Triage Notes (Signed)
Pt reports having mid abd pain that started yesterday with n/v. Pt went to [facility] this am and given zofran, then came here. Reports having episodes of this in past and was unable to follow up with GI dr. No acute distress is noted at this time.

## 2019-12-22 DIAGNOSIS — F12188 Cannabis abuse with other cannabis-induced disorder: Secondary | ICD-10-CM | POA: Insufficient documentation

## 2020-02-01 ENCOUNTER — Encounter (HOSPITAL_COMMUNITY): Payer: Self-pay

## 2020-02-01 ENCOUNTER — Other Ambulatory Visit: Payer: Self-pay

## 2020-02-01 ENCOUNTER — Ambulatory Visit (HOSPITAL_COMMUNITY)
Admission: EM | Admit: 2020-02-01 | Discharge: 2020-02-01 | Disposition: A | Discharge status: Home / Self Care | Payer: 59 | Attending: Physician Assistant | Admitting: Physician Assistant

## 2020-02-01 ENCOUNTER — Ambulatory Visit (INDEPENDENT_AMBULATORY_CARE_PROVIDER_SITE_OTHER): Payer: 59

## 2020-02-01 DIAGNOSIS — S39012A Strain of muscle, fascia and tendon of lower back, initial encounter: Secondary | ICD-10-CM

## 2020-02-01 DIAGNOSIS — M79642 Pain in left hand: Secondary | ICD-10-CM

## 2020-02-01 DIAGNOSIS — M542 Cervicalgia: Secondary | ICD-10-CM

## 2020-02-01 DIAGNOSIS — S60222A Contusion of left hand, initial encounter: Secondary | ICD-10-CM
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
## 2020-02-01 MED ORDER — CYCLOBENZAPRINE HCL 5 MG PO TABS
5.0000 mg | ORAL_TABLET | Freq: Three times a day (TID) | ORAL | 0 refills | Status: DC | PRN
Start: 2020-02-01 — End: 2020-03-09

## 2020-02-01 MED ORDER — PREDNISONE 10 MG (21) PO TBPK: ORAL_TABLET | ORAL | 0 refills | Status: DC
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
## 2020-02-01 NOTE — Discharge Instructions (Addendum)
Take medication as prescribed Apply ice and head to neck and back 15 minutes 4 times per day Apply ice to hand 15 minutes 4 times per day  Stretch neck and back daily as discussed Follow up with PCP if no improvement in 1 week

## 2020-02-07 ENCOUNTER — Ambulatory Visit (INDEPENDENT_AMBULATORY_CARE_PROVIDER_SITE_OTHER): Payer: 59 | Admitting: Primary Care

## 2020-02-07 ENCOUNTER — Other Ambulatory Visit: Payer: Self-pay

## 2020-02-07 ENCOUNTER — Encounter (INDEPENDENT_AMBULATORY_CARE_PROVIDER_SITE_OTHER): Payer: Self-pay | Admitting: Primary Care

## 2020-02-07 VITALS — BP 137/69 | HR 65 | Temp 97.3°F | Ht 75.0 in | Wt 182.0 lb

## 2020-02-07 DIAGNOSIS — Z09 Encounter for follow-up examination after completed treatment for conditions other than malignant neoplasm: Secondary | ICD-10-CM

## 2020-02-07 DIAGNOSIS — Z7689 Persons encountering health services in other specified circumstances: Secondary | ICD-10-CM | POA: Diagnosis not present

## 2020-02-07 DIAGNOSIS — S60222D Contusion of left hand, subsequent encounter: Secondary | ICD-10-CM

## 2020-02-07 DIAGNOSIS — M542 Cervicalgia: Secondary | ICD-10-CM | POA: Diagnosis not present
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
## 2020-02-10 ENCOUNTER — Ambulatory Visit (INDEPENDENT_AMBULATORY_CARE_PROVIDER_SITE_OTHER): Payer: 59

## 2020-02-10 ENCOUNTER — Ambulatory Visit (INDEPENDENT_AMBULATORY_CARE_PROVIDER_SITE_OTHER): Payer: 59 | Admitting: Family Medicine

## 2020-02-10 ENCOUNTER — Other Ambulatory Visit: Payer: Self-pay

## 2020-02-10 DIAGNOSIS — M79642 Pain in left hand: Secondary | ICD-10-CM

## 2020-02-10 DIAGNOSIS — M542 Cervicalgia: Secondary | ICD-10-CM | POA: Diagnosis not present

## 2020-02-10 DIAGNOSIS — M545 Low back pain, unspecified: Secondary | ICD-10-CM | POA: Diagnosis not present

## 2020-02-10 MED ORDER — TIZANIDINE HCL 2 MG PO TABS: 2.0000 mg | ORAL_TABLET | Freq: Four times a day (QID) | ORAL | 1 refills | Status: DC | PRN
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
## 2020-02-15 ENCOUNTER — Encounter: Payer: Self-pay | Admitting: Physical Therapy

## 2020-02-15 ENCOUNTER — Other Ambulatory Visit: Payer: Self-pay

## 2020-02-15 ENCOUNTER — Ambulatory Visit (INDEPENDENT_AMBULATORY_CARE_PROVIDER_SITE_OTHER): Payer: 59 | Admitting: Physical Therapy

## 2020-02-15 DIAGNOSIS — M79642 Pain in left hand: Secondary | ICD-10-CM | POA: Diagnosis not present

## 2020-02-15 DIAGNOSIS — M545 Low back pain, unspecified: Secondary | ICD-10-CM

## 2020-02-15 DIAGNOSIS — M6281 Muscle weakness (generalized): Secondary | ICD-10-CM

## 2020-02-15 DIAGNOSIS — G8929 Other chronic pain: Secondary | ICD-10-CM

## 2020-02-15 DIAGNOSIS — M542 Cervicalgia: Secondary | ICD-10-CM | POA: Diagnosis not present

## 2020-02-15 NOTE — Patient Instructions (Signed)
Access Code: [email or web address] URL: [email or web address] Date: 02/15/2020 Prepared by: Ivery Quale  Exercises Seated Cervical Sidebending Stretch - 2 x daily - 6 x weekly - 1 sets - 2-3 reps - 30 sec hold Seated Levator Scapulae Stretch - 2 x daily - 6 x weekly - 2-3 reps - 30 hold Seated Thoracic Lumbar Extension - 2 x daily - 6 x weekly - 1-2 sets - 10 reps - 5 hold Supine Lower Trunk Rotation - 2 x daily - 6 x weekly - 1 sets - 10 reps - 5 sec hold Supine Piriformis Stretch with Foot on Ground - 2 x daily - 6 x weekly - 2-3 sets - 30 hold Supine Bridge - 2 x daily - 6 x weekly - 1-2 sets - 10 reps - 5 hold Seated Gripping Towel - 2 x daily - 6 x weekly - 2 sets - 20 reps
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
## 2020-02-28 ENCOUNTER — Encounter: Payer: 59 | Admitting: Rehabilitative and Restorative Service Providers"

## 2020-03-07 ENCOUNTER — Ambulatory Visit (INDEPENDENT_AMBULATORY_CARE_PROVIDER_SITE_OTHER): Payer: 59 | Admitting: Physical Therapy

## 2020-03-07 ENCOUNTER — Encounter: Payer: Self-pay | Admitting: Physical Therapy

## 2020-03-07 ENCOUNTER — Other Ambulatory Visit: Payer: Self-pay

## 2020-03-07 DIAGNOSIS — M6281 Muscle weakness (generalized): Secondary | ICD-10-CM | POA: Diagnosis not present

## 2020-03-07 DIAGNOSIS — M542 Cervicalgia: Secondary | ICD-10-CM | POA: Diagnosis not present

## 2020-03-07 DIAGNOSIS — M545 Low back pain, unspecified: Secondary | ICD-10-CM

## 2020-03-07 DIAGNOSIS — M79642 Pain in left hand: Secondary | ICD-10-CM | POA: Diagnosis not present

## 2020-03-07 DIAGNOSIS — G8929 Other chronic pain: Secondary | ICD-10-CM
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
## 2020-03-09 ENCOUNTER — Ambulatory Visit (INDEPENDENT_AMBULATORY_CARE_PROVIDER_SITE_OTHER): Payer: 59 | Admitting: Family Medicine

## 2020-03-09 ENCOUNTER — Encounter: Payer: Self-pay | Admitting: Family Medicine

## 2020-03-09 ENCOUNTER — Ambulatory Visit (INDEPENDENT_AMBULATORY_CARE_PROVIDER_SITE_OTHER): Payer: 59 | Admitting: Rehabilitative and Restorative Service Providers"

## 2020-03-09 ENCOUNTER — Other Ambulatory Visit: Payer: Self-pay

## 2020-03-09 DIAGNOSIS — M542 Cervicalgia: Secondary | ICD-10-CM

## 2020-03-09 DIAGNOSIS — M545 Low back pain, unspecified: Secondary | ICD-10-CM

## 2020-03-09 DIAGNOSIS — M6281 Muscle weakness (generalized): Secondary | ICD-10-CM | POA: Diagnosis not present

## 2020-03-09 DIAGNOSIS — M79642 Pain in left hand: Secondary | ICD-10-CM | POA: Diagnosis not present

## 2020-03-09 DIAGNOSIS — G8929 Other chronic pain: Secondary | ICD-10-CM

## 2020-03-09 MED ORDER — TIZANIDINE HCL 2 MG PO TABS: 2.0000 mg | ORAL_TABLET | Freq: Four times a day (QID) | ORAL | 1 refills | Status: DC | PRN
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
## 2020-03-13 ENCOUNTER — Encounter: Payer: 59 | Admitting: Rehabilitative and Restorative Service Providers"

## 2020-03-16 ENCOUNTER — Ambulatory Visit (INDEPENDENT_AMBULATORY_CARE_PROVIDER_SITE_OTHER): Payer: 59 | Admitting: Rehabilitative and Restorative Service Providers"

## 2020-03-16 ENCOUNTER — Encounter: Payer: Self-pay | Admitting: Rehabilitative and Restorative Service Providers"

## 2020-03-16 ENCOUNTER — Other Ambulatory Visit: Payer: Self-pay

## 2020-03-16 DIAGNOSIS — M542 Cervicalgia: Secondary | ICD-10-CM

## 2020-03-16 DIAGNOSIS — M6281 Muscle weakness (generalized): Secondary | ICD-10-CM | POA: Diagnosis not present

## 2020-03-16 DIAGNOSIS — G8929 Other chronic pain: Secondary | ICD-10-CM

## 2020-03-16 DIAGNOSIS — M545 Low back pain, unspecified: Secondary | ICD-10-CM

## 2020-03-16 DIAGNOSIS — M79642 Pain in left hand: Secondary | ICD-10-CM | POA: Diagnosis not present
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
## 2020-03-21 ENCOUNTER — Other Ambulatory Visit: Payer: Self-pay

## 2020-03-21 ENCOUNTER — Encounter: Payer: Self-pay | Admitting: Rehabilitative and Restorative Service Providers"

## 2020-03-21 ENCOUNTER — Ambulatory Visit (INDEPENDENT_AMBULATORY_CARE_PROVIDER_SITE_OTHER): Payer: 59 | Admitting: Rehabilitative and Restorative Service Providers"

## 2020-03-21 DIAGNOSIS — M79642 Pain in left hand: Secondary | ICD-10-CM

## 2020-03-21 DIAGNOSIS — M6281 Muscle weakness (generalized): Secondary | ICD-10-CM | POA: Diagnosis not present

## 2020-03-21 DIAGNOSIS — M545 Low back pain, unspecified: Secondary | ICD-10-CM | POA: Diagnosis not present

## 2020-03-21 DIAGNOSIS — M542 Cervicalgia: Secondary | ICD-10-CM

## 2020-03-21 DIAGNOSIS — G8929 Other chronic pain: Secondary | ICD-10-CM
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
## 2020-03-23 ENCOUNTER — Other Ambulatory Visit: Payer: Self-pay

## 2020-03-23 ENCOUNTER — Encounter: Payer: Self-pay | Admitting: Rehabilitative and Restorative Service Providers"

## 2020-03-23 ENCOUNTER — Ambulatory Visit (INDEPENDENT_AMBULATORY_CARE_PROVIDER_SITE_OTHER): Payer: 59 | Admitting: Rehabilitative and Restorative Service Providers"

## 2020-03-23 DIAGNOSIS — M542 Cervicalgia: Secondary | ICD-10-CM

## 2020-03-23 DIAGNOSIS — M545 Low back pain, unspecified: Secondary | ICD-10-CM | POA: Diagnosis not present

## 2020-03-23 DIAGNOSIS — M79642 Pain in left hand: Secondary | ICD-10-CM

## 2020-03-23 DIAGNOSIS — M6281 Muscle weakness (generalized): Secondary | ICD-10-CM | POA: Diagnosis not present

## 2020-03-23 DIAGNOSIS — G8929 Other chronic pain: Secondary | ICD-10-CM
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
## 2020-03-27 ENCOUNTER — Telehealth: Payer: Self-pay | Admitting: Family Medicine

## 2020-03-27 ENCOUNTER — Ambulatory Visit
Admission: RE | Admit: 2020-03-27 | Discharge: 2020-03-27 | Disposition: A | Discharge status: Home / Self Care | Payer: 59 | Source: Ambulatory Visit | Attending: Family Medicine | Admitting: Family Medicine

## 2020-03-27 DIAGNOSIS — M542 Cervicalgia: Secondary | ICD-10-CM

## 2020-03-27 DIAGNOSIS — M545 Low back pain, unspecified: Secondary | ICD-10-CM

## 2020-03-27 IMAGING — MR MR CERVICAL SPINE W/O CM
5 series · 37 of 48 positions shown · non-contrast
Comparison: Plain films [DATE].

CLINICAL DATA: Neck pain, acute.

EXAM:
MRI CERVICAL SPINE WITHOUT CONTRAST
TECHNIQUE: Multiplanar, multisequence MR imaging of the cervical spine was
performed. No intravenous contrast was administered.

[Series 2: T2 · sagittal · 3.0mm · 0.41mm/px · 8 of 19 slices shown (1 of 2)]
[im 1/19]
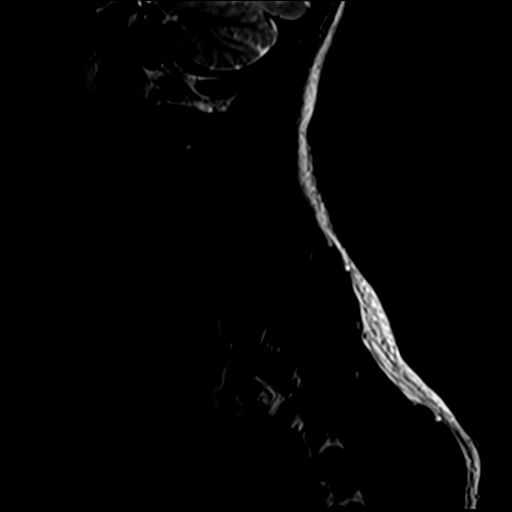
[im 3/19]
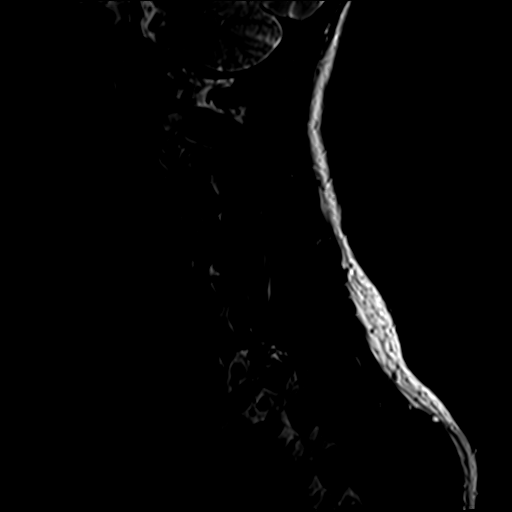
[im 6/19]
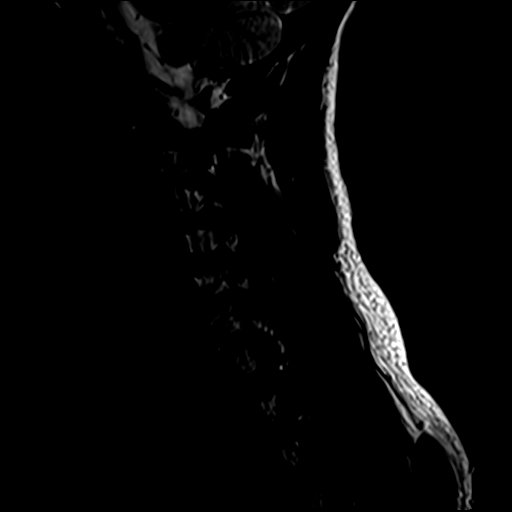
[im 8/19]
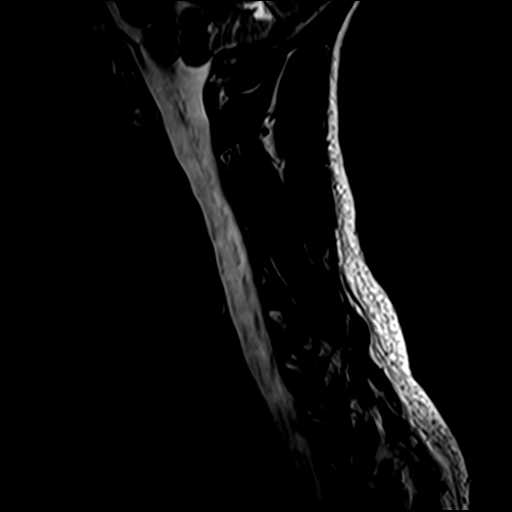
[im 11/19]
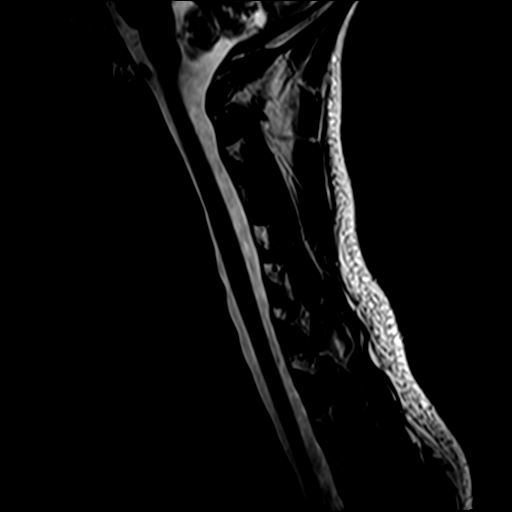
[im 13/19]
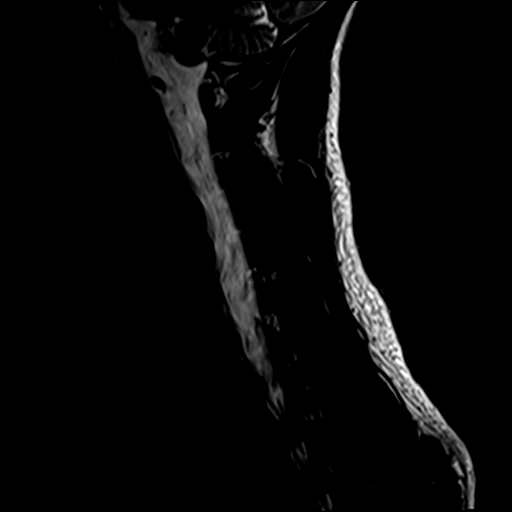
[im 16/19]
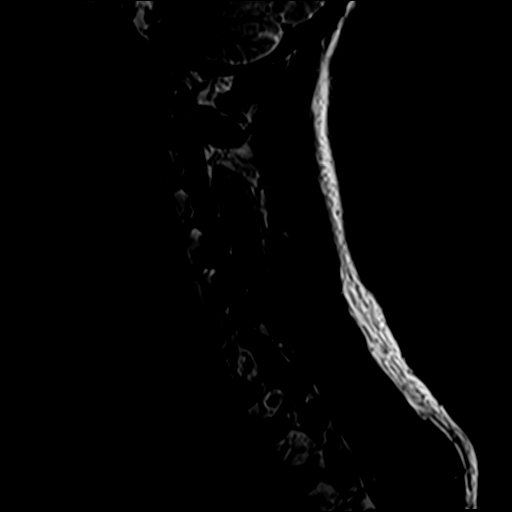
[im 19/19]
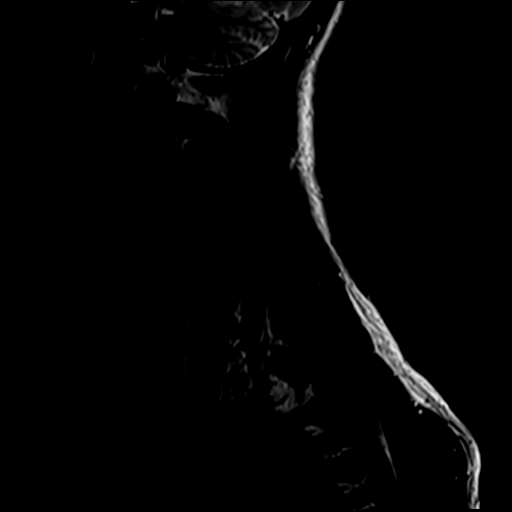

[Series 3: STIR · sagittal · 3.0mm · 0.82mm/px · 8 of 19 slices shown]
[im 1/19]
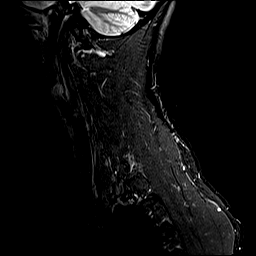
[im 3/19]
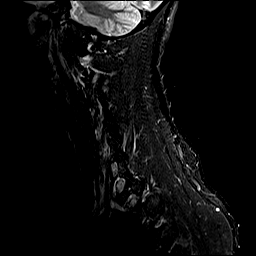
[im 6/19]
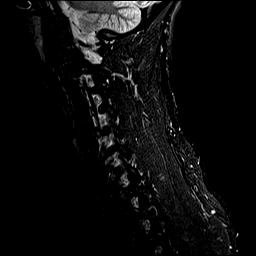
[im 8/19]
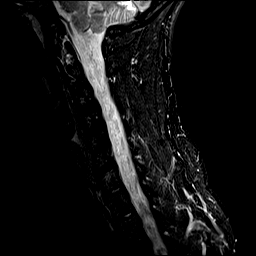
[im 11/19]
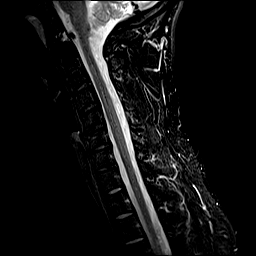
[im 13/19]
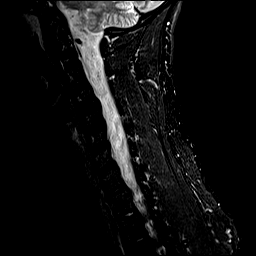
[im 16/19]
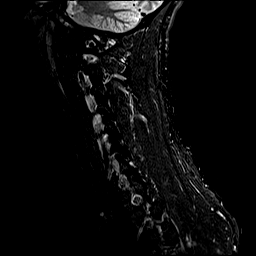
[im 19/19]
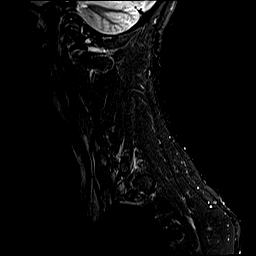

[Series 4: T1 · sagittal · 3.0mm · 0.82mm/px · 8 of 19 slices shown]
[im 1/19]
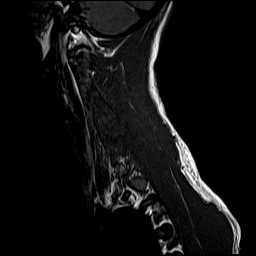
[im 3/19]
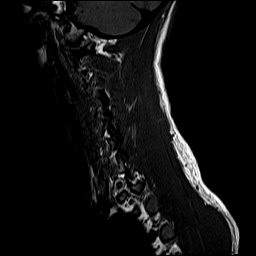
[im 6/19]
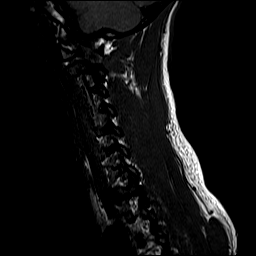
[im 8/19]
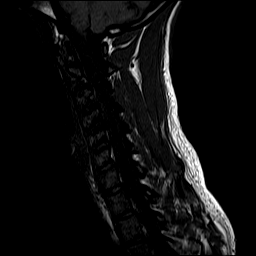
[im 11/19]
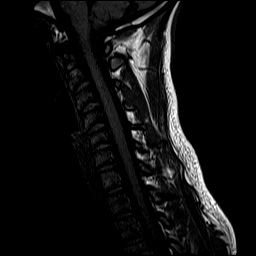
[im 13/19]
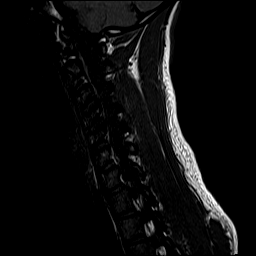
[im 16/19]
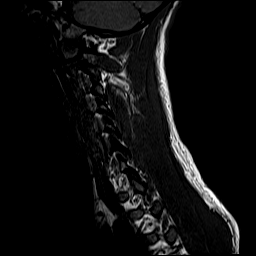
[im 19/19]
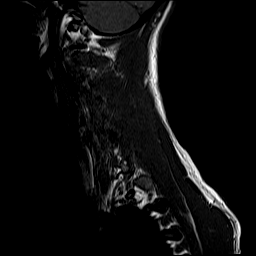

[Series 5: T2 · axial · 3.0mm · 0.70mm/px · z∈[-88,+18]mm · 9 of 30 slices shown (2 of 2)]
[im 1/30]
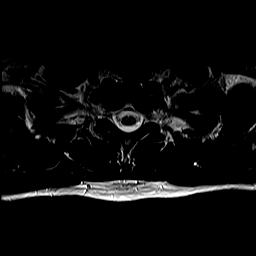
[im 6/30]
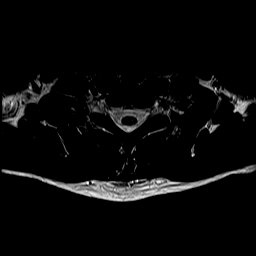
[im 8/30]
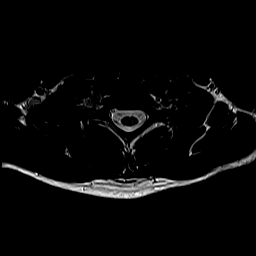
[im 14/30]
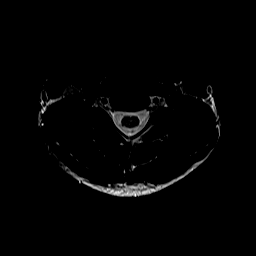
[im 16/30]
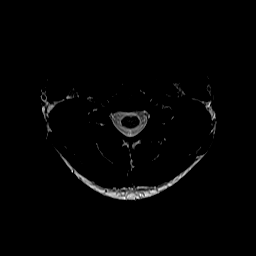
[im 22/30]
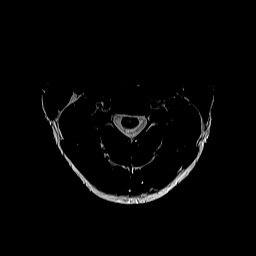
[im 24/30]
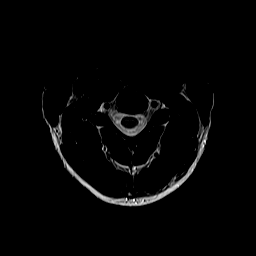
[im 27/30]
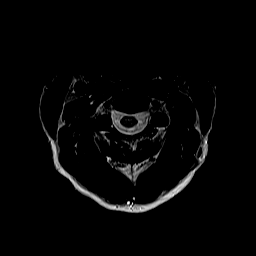
[im 30/30]
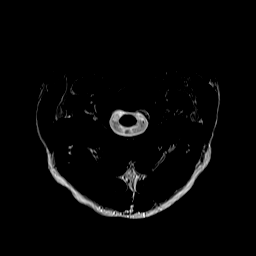

[Series 6: GRE · axial · 3.0mm · 0.35mm/px · z∈[-88,-41]mm · 4 of 30 slices shown]
[im 1/30]
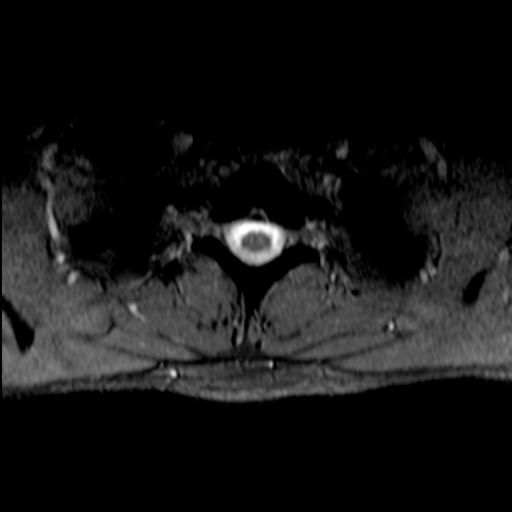
[im 6/30]
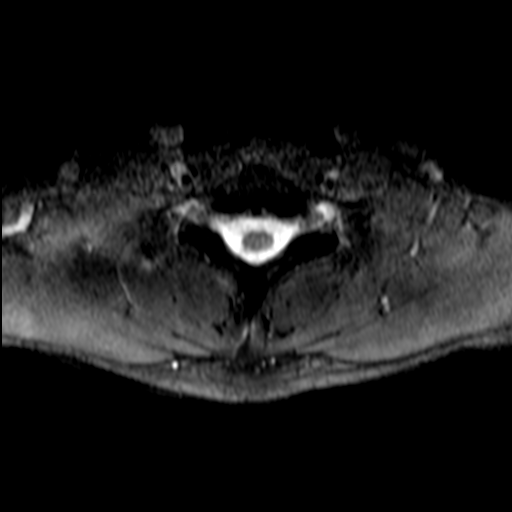
[im 8/30]
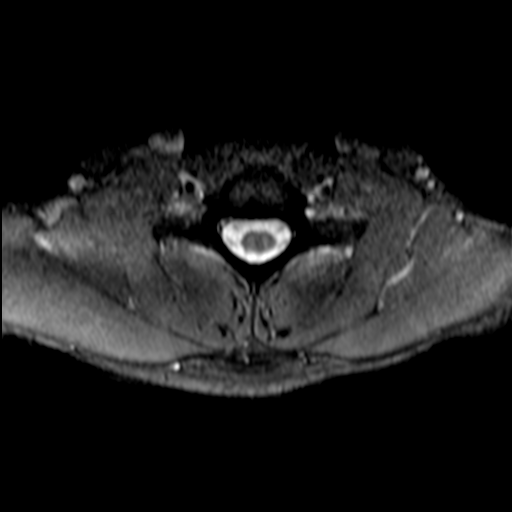
[im 14/30]
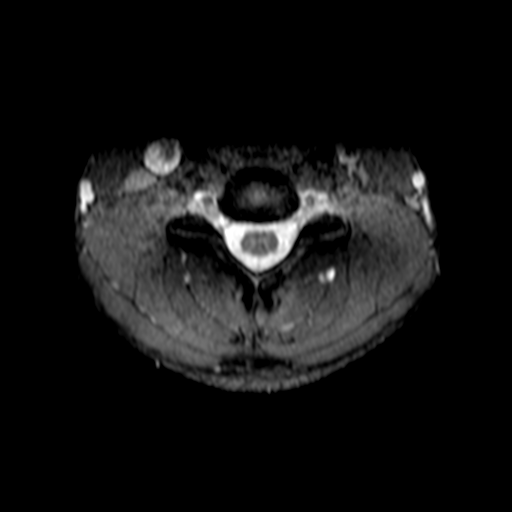

[37 of 48 positions shown; findings below may reference images not displayed]

FINDINGS: Alignment: Mild reversal of the cervical curvature.

Vertebrae: No fracture, evidence of discitis, or bone lesion.

Cord: Normal signal and morphology.

Posterior Fossa, vertebral arteries, paraspinal tissues: Negative.

Disc levels:

No disc herniation, spinal canal or neural foraminal stenosis at any
level.
IMPRESSION: No acute abnormality of the cervical spine. No significant
degenerative changes.
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
## 2020-03-27 NOTE — Telephone Encounter (Signed)
MRI looks good.  No arthritic changes, no sign of fracture, no ruptured discs and no pinched nerves.  There is straightening of the spine most likely due to muscle spasm.  No indication for surgery.  Symptoms will typically resolve with time and with therapy.

## 2020-03-27 NOTE — Telephone Encounter (Signed)
Lumbar MRI scan shows a tiny left-sided disc protrusion at L3-4 touching the left L3 nerve root.  There is a slight disc protrusion at L4-5 which does not compress any nerves.  There is another 1 at 1-2 which again does not compress any nerves.  There are changes in the bone marrow of the spine which commonly occur with smoking.  No indication for surgery, most often disc protrusions like this will heal with conservative management.  If symptoms do not resolve with physical therapy and chiropractic, then could contemplate referral for epidural steroid injection prior to surgical consult.  Would suggest smoking cessation.

## 2020-03-28 ENCOUNTER — Encounter: Payer: 59 | Admitting: Rehabilitative and Restorative Service Providers"

## 2020-03-28 ENCOUNTER — Ambulatory Visit: Payer: 59 | Admitting: Family Medicine

## 2020-03-29 ENCOUNTER — Ambulatory Visit: Payer: 59 | Admitting: Family Medicine

## 2020-03-30 ENCOUNTER — Encounter: Payer: 59 | Admitting: Rehabilitative and Restorative Service Providers"

## 2020-04-03 ENCOUNTER — Ambulatory Visit: Payer: 59 | Admitting: Family Medicine

## 2020-04-07 ENCOUNTER — Other Ambulatory Visit: Payer: Self-pay

## 2020-04-07 ENCOUNTER — Encounter: Payer: Self-pay | Admitting: Family Medicine

## 2020-04-07 ENCOUNTER — Ambulatory Visit (INDEPENDENT_AMBULATORY_CARE_PROVIDER_SITE_OTHER): Payer: 59 | Admitting: Family Medicine

## 2020-04-07 DIAGNOSIS — M5126 Other intervertebral disc displacement, lumbar region: Secondary | ICD-10-CM | POA: Diagnosis not present
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
## 2020-04-11 ENCOUNTER — Encounter: Payer: 59 | Admitting: Rehabilitative and Restorative Service Providers"

## 2020-04-19 ENCOUNTER — Other Ambulatory Visit: Payer: Self-pay | Admitting: Family Medicine

## 2020-04-19 DIAGNOSIS — M5126 Other intervertebral disc displacement, lumbar region: Secondary | ICD-10-CM

## 2020-04-20 ENCOUNTER — Other Ambulatory Visit: Payer: Self-pay

## 2020-04-20 ENCOUNTER — Encounter (INDEPENDENT_AMBULATORY_CARE_PROVIDER_SITE_OTHER): Payer: Self-pay | Admitting: Primary Care

## 2020-04-20 ENCOUNTER — Ambulatory Visit (INDEPENDENT_AMBULATORY_CARE_PROVIDER_SITE_OTHER): Payer: 59 | Admitting: Primary Care

## 2020-04-20 VITALS — BP 133/76 | HR 73 | Temp 98.4°F | Ht 75.0 in | Wt 170.8 lb

## 2020-04-20 DIAGNOSIS — G4700 Insomnia, unspecified: Secondary | ICD-10-CM

## 2020-04-20 DIAGNOSIS — F411 Generalized anxiety disorder: Secondary | ICD-10-CM

## 2020-04-20 MED ORDER — TRAZODONE HCL 50 MG PO TABS: 50.0000 mg | ORAL_TABLET | Freq: Every day | ORAL | 1 refills | Status: DC

## 2020-04-20 NOTE — Progress Notes (Signed)
Established Patient Office Visit  Subjective:  Patient ID: Justin Ferrell, male    DOB: 01/13/1991  Age: 30 y.o. MRN: [id number]  CC:  Chief Complaint  Patient presents with  . Anxiety    HPI Justin Ferrell is 30 year old male presents for anxiety caused by GI symptoms - GERD and nauseas and vomiting - happen every time he eats, GI tx with PPI/zofran. Defer back to GI  History reviewed. No pertinent past medical history.  History reviewed. No pertinent surgical history.  Family History  Family history unknown: Yes    Social History   Socioeconomic History  . Marital status: Single    Spouse name: Not on file  . Number of children: Not on file  . Years of education: Not on file  . Highest education level: Not on file  Occupational History  . Not on file  Tobacco Use  . Smoking status: Former Smoker    Types: Cigars  . Smokeless tobacco: Never Used  Vaping Use  . Vaping Use: Never used  Substance and Sexual Activity  . Alcohol use: Never  . Drug use: Yes    Types: Marijuana  . Sexual activity: Not Currently  Other Topics Concern  . Not on file  Social History Narrative  . Not on file   Social Determinants of Health   Financial Resource Strain: Not on file  Food Insecurity: Not on file  Transportation Needs: Not on file  Physical Activity: Not on file  Stress: Not on file  Social Connections: Not on file  Intimate Partner Violence: Not on file    Outpatient Medications Prior to Visit  Medication Sig Dispense Refill  . esomeprazole (NEXIUM) 40 MG capsule     . tiZANidine (ZANAFLEX) 2 MG tablet Take 1-2 tablets (2-4 mg total) by mouth every 6 (six) hours as needed for muscle spasms. 60 tablet 1  . traMADol (ULTRAM) 50 MG tablet Take 50 mg by mouth every 8 (eight) hours as needed.     No facility-administered medications prior to visit.    Allergies  Allergen Reactions  . Metoclopramide Anxiety    ROS    Objective:     Physical Exam  BP 133/76 (BP Location: Right Arm, Patient Position: Sitting, Cuff Size: Normal)   Pulse 73   Temp 98.4 F (36.9 C) (Temporal)   Ht _0  (1.905 m)   Wt 170 lb 12.8 oz (77.5 kg)   SpO2 97%   BMI 21.35 kg/m  Wt Readings from Last 3 Encounters:  04/20/20 170 lb 12.8 oz (77.5 kg)  02/07/20 182 lb (82.6 kg)  11/25/19 180 lb (81.6 kg)   General: Vital signs reviewed.  Patient is well-developed and well-nourished, in no acute distress and cooperative with exam.  Head: Normocephalic and atraumatic. Eyes: EOMI, conjunctivae normal, no scleral icterus.  Neck: Supple, trachea midline, normal ROM, no JVD, masses, thyromegaly, or carotid bruit present.  Cardiovascular: RRR, S1 normal, S2 normal, no murmurs, gallops, or rubs. Pulmonary/Chest: Clear to auscultation bilaterally, no wheezes, rales, or rhonchi. Abdominal: Soft, non-tender, non-distended, BS +, no masses, organomegaly, or guarding present.  Musculoskeletal: No joint deformities, erythema, or stiffness, ROM full and nontender. Extremities: No lower extremity edema bilaterally,  pulses symmetric and intact bilaterally. No cyanosis or clubbing. Neurological: A&O x3, Strength is normal and symmetric bilaterally Skin: Warm, dry and intact. No rashes or erythema. Psychiatric: Normal mood and affect. speech and behavior is normal. Cognition and memory are normal.  Health Maintenance Due  Topic Date Due  . COVID-19 Vaccine (1) Never done  . HIV Screening  Never done    There are no preventive care reminders to display for this patient.  No results found for: TSH Lab Results  Component Value Date   WBC 13.9 (H) 06/20/2019   HGB 16.2 06/20/2019   HCT 45.3 06/20/2019   MCV 85.0 06/20/2019   PLT 297 06/20/2019   Lab Results  Component Value Date   NA 137 06/20/2019   K 3.7 06/20/2019   CO2 19 (L) 06/20/2019   GLUCOSE 112 (H) 06/20/2019   BUN 19 06/20/2019   CREATININE 0.91 06/20/2019   BILITOT 2.4 (H)  06/20/2019   ALKPHOS 50 06/20/2019   AST 32 06/20/2019   ALT 25 06/20/2019   PROT 8.6 (H) 06/20/2019   ALBUMIN 5.4 (H) 06/20/2019   CALCIUM 10.4 (H) 06/20/2019   ANIONGAP 14 06/20/2019     Assessment & Plan:  Justin Ferrell was seen today for anxiety.  Diagnoses and all orders for this visit:  Insomnia, unspecified type Insomnia is frequent trouble falling and/or staying asleep. Insomnia can be a long term problem or a short term problem. Both are common. Insomnia can be a short term problem when the wakefulness is related to a certain stress or worry. Long term insomnia is often related to ongoing stress during waking hours and/or poor sleeping habits. Overtime, sleep deprivation itself can make the problem worse. Every little thing feels more severe because you are overtired and your ability to cope is decreased. -     CBC with Differential -     CMP14+EGFR -     TSH + free T4  GAD (generalized anxiety disorder)  We discussed options for treatment of anxiety including therapy and/or medication.  Will check basic labs to ensure thyroid is in normal range and that no other metabolic issues are obvious.  Reviewed concept of anxiety as biochemical imbalance of neurotransmitters and rationale for treatment. Discussed potential risks, expected benefits, possible side effects of the medicine. We also discussed how to take it correctly and dosing instructions. If he has any significant side effects to the medicine, he is to stop it and call for advice.  Instructed patient to contact office or on-call physician promptly should condition worsen or any new symptoms appear.    He was agreeable with this plan.   Spent 25 minutes (>50% of visit) discussing the risks of anxiety disorder, the pathophysiology, etiology, risks, and principles of treatment.  -     CBC with Differential -     CMP14+EGFR -     TSH + free T4  Other orders -     traZODone (DESYREL) 50 MG tablet; Take 1 tablet (50 mg total)  by mouth at bedtime.    Follow-up: Return in about 7 weeks (around 06/08/2020) for medication f/u.    Kerin Perna, NP

## 2020-04-21 LAB — CMP14+EGFR
ALT: 22 IU/L (ref 0–44)
AST: 22 IU/L (ref 0–40)
Albumin/Globulin Ratio: 1.9 (ref 1.2–2.2)
Albumin: 5.1 g/dL (ref 4.1–5.2)
Alkaline Phosphatase: 54 IU/L (ref 44–121)
BUN/Creatinine Ratio: 9 (ref 9–20)
BUN: 11 mg/dL (ref 6–20)
Bilirubin Total: 1.5 mg/dL — ABNORMAL HIGH (ref 0.0–1.2)
CO2: 20 mmol/L (ref 20–29)
Calcium: 10.4 mg/dL — ABNORMAL HIGH (ref 8.7–10.2)
Chloride: 102 mmol/L (ref 96–106)
Creatinine, Ser: 1.16 mg/dL (ref 0.76–1.27)
GFR calc Af Amer: 99 mL/min/{1.73_m2} (ref >59)
GFR calc non Af Amer: 86 mL/min/{1.73_m2} (ref >59)
Globulin, Total: 2.7 g/dL (ref 1.5–4.5)
Glucose: 96 mg/dL (ref 65–99)
Potassium: 4.4 mmol/L (ref 3.5–5.2)
Sodium: 142 mmol/L (ref 134–144)
Total Protein: 7.8 g/dL (ref 6.0–8.5)

## 2020-04-21 LAB — CBC WITH DIFFERENTIAL/PLATELET
Basophils Absolute: 0 10*3/uL (ref 0.0–0.2)
Basos: 0 %
EOS (ABSOLUTE): 0 10*3/uL (ref 0.0–0.4)
Eos: 0 %
Hematocrit: 46 % (ref 37.5–51.0)
Hemoglobin: 15.7 g/dL (ref 13.0–17.7)
Immature Grans (Abs): 0 10*3/uL (ref 0.0–0.1)
Immature Granulocytes: 0 %
Lymphocytes Absolute: 1.6 10*3/uL (ref 0.7–3.1)
Lymphs: 16 %
MCH: 30 pg (ref 26.6–33.0)
MCHC: 34.1 g/dL (ref 31.5–35.7)
MCV: 88 fL (ref 79–97)
Monocytes Absolute: 0.6 10*3/uL (ref 0.1–0.9)
Monocytes: 6 %
Neutrophils Absolute: 7.7 10*3/uL — ABNORMAL HIGH (ref 1.4–7.0)
Neutrophils: 78 %
Platelets: 290 10*3/uL (ref 150–450)
RBC: 5.24 x10E6/uL (ref 4.14–5.80)
RDW: 13.6 % (ref 11.6–15.4)
WBC: 9.9 10*3/uL (ref 3.4–10.8)

## 2020-04-21 LAB — TSH+FREE T4
Free T4: 1.22 ng/dL (ref 0.82–1.77)
TSH: 0.487 u[IU]/mL (ref 0.450–4.500)

## 2020-04-24 ENCOUNTER — Other Ambulatory Visit: Payer: Self-pay

## 2020-04-24 ENCOUNTER — Ambulatory Visit (INDEPENDENT_AMBULATORY_CARE_PROVIDER_SITE_OTHER): Payer: 59 | Admitting: Rehabilitative and Restorative Service Providers"

## 2020-04-24 ENCOUNTER — Encounter: Payer: Self-pay | Admitting: Rehabilitative and Restorative Service Providers"

## 2020-04-24 DIAGNOSIS — M6281 Muscle weakness (generalized): Secondary | ICD-10-CM | POA: Diagnosis not present

## 2020-04-24 DIAGNOSIS — M545 Low back pain, unspecified: Secondary | ICD-10-CM | POA: Diagnosis not present

## 2020-04-24 DIAGNOSIS — M79642 Pain in left hand: Secondary | ICD-10-CM | POA: Diagnosis not present

## 2020-04-24 DIAGNOSIS — M542 Cervicalgia: Secondary | ICD-10-CM | POA: Diagnosis not present

## 2020-04-24 DIAGNOSIS — G8929 Other chronic pain: Secondary | ICD-10-CM

## 2020-04-24 NOTE — Patient Instructions (Signed)
Access Code: [email or web address] URL: [email or web address] Date: 04/24/2020 Prepared by: Chyrel Masson  Exercises Supine Lower Trunk Rotation - 2 x daily - 7 x weekly - 1 sets - 10 reps - 5 sec hold Supine Piriformis Stretch with Foot on Ground - 2 x daily - 7 x weekly - 2-3 sets - 30 hold Supine Bridge - 2 x daily - 7 x weekly - 1-2 sets - 10 reps - 5 hold Sidelying Hip Abduction - 2 x daily - 7 x weekly - 3 sets - 10 reps Side Plank on Knees - 1 x daily - 7 x weekly - 1 sets - 5 reps - to fatigue hold Standing Lumbar Extension - 2 x daily - 7 x weekly - 10 reps - 2-3 sets Plank on Knees - 1 x daily - 7 x weekly - 1 sets - 5 reps - to fatigue hold
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
## 2020-04-26 ENCOUNTER — Encounter: Payer: Self-pay | Admitting: Physical Medicine and Rehabilitation

## 2020-04-26 ENCOUNTER — Other Ambulatory Visit: Payer: Self-pay

## 2020-04-26 ENCOUNTER — Ambulatory Visit (INDEPENDENT_AMBULATORY_CARE_PROVIDER_SITE_OTHER): Payer: 59 | Admitting: Physical Medicine and Rehabilitation

## 2020-04-26 VITALS — BP 117/76 | HR 74

## 2020-04-26 DIAGNOSIS — M5442 Lumbago with sciatica, left side: Secondary | ICD-10-CM | POA: Diagnosis not present

## 2020-04-26 DIAGNOSIS — M5416 Radiculopathy, lumbar region: Secondary | ICD-10-CM | POA: Diagnosis not present

## 2020-04-26 DIAGNOSIS — G8929 Other chronic pain: Secondary | ICD-10-CM | POA: Diagnosis not present

## 2020-04-26 DIAGNOSIS — M5116 Intervertebral disc disorders with radiculopathy, lumbar region: Secondary | ICD-10-CM

## 2020-04-26 NOTE — Progress Notes (Signed)
Low back pain worse on left. Pain lateral left hip. PT has helped some. Numeric Pain Rating Scale and Functional Assessment Average Pain 8 Pain Right Now 8 My pain is constant, dull and aching Pain is worse with: sitting and lifting Pain improves with: therapy/exercise   In the last MONTH (on 0-10 scale) has pain interfered with the following?  1. General activity like being  able to carry out your everyday physical activities such as walking, climbing stairs, carrying groceries, or moving a chair?  Rating(8)  2. Relation with others like being able to carry out your usual social activities and roles such as  activities at home, at work and in your community. Rating(8)  3. Enjoyment of life such that you have  been bothered by emotional problems such as feeling anxious, depressed or irritable?  Rating(8)

## 2020-04-27 ENCOUNTER — Encounter: Payer: Self-pay | Admitting: Physical Medicine and Rehabilitation
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
## 2020-05-20 ENCOUNTER — Emergency Department (HOSPITAL_COMMUNITY)
Admission: EM | Admit: 2020-05-20 | Discharge: 2020-05-20 | Disposition: A | Discharge status: Home / Self Care | Payer: 59 | Attending: Emergency Medicine | Admitting: Emergency Medicine

## 2020-05-20 ENCOUNTER — Ambulatory Visit: Payer: Self-pay

## 2020-05-20 ENCOUNTER — Other Ambulatory Visit: Payer: Self-pay

## 2020-05-20 DIAGNOSIS — Z87891 Personal history of nicotine dependence: Secondary | ICD-10-CM | POA: Diagnosis not present

## 2020-05-20 DIAGNOSIS — R1013 Epigastric pain: Secondary | ICD-10-CM | POA: Insufficient documentation

## 2020-05-20 DIAGNOSIS — R112 Nausea with vomiting, unspecified: Secondary | ICD-10-CM | POA: Diagnosis not present

## 2020-05-20 LAB — URINALYSIS, ROUTINE W REFLEX MICROSCOPIC
Bacteria, UA: NONE SEEN
Bilirubin Urine: NEGATIVE
Glucose, UA: NEGATIVE mg/dL
Hgb urine dipstick: NEGATIVE
Ketones, ur: 20 mg/dL — AB
Leukocytes,Ua: NEGATIVE
Nitrite: NEGATIVE
Protein, ur: 30 mg/dL — AB
Specific Gravity, Urine: 1.03 (ref 1.005–1.030)
pH: 6 (ref 5.0–8.0)

## 2020-05-20 LAB — CBC
HCT: 45.3 % (ref 39.0–52.0)
Hemoglobin: 15.5 g/dL (ref 13.0–17.0)
MCH: 29.7 pg (ref 26.0–34.0)
MCHC: 34.2 g/dL (ref 30.0–36.0)
MCV: 86.8 fL (ref 80.0–100.0)
Platelets: 309 10*3/uL (ref 150–400)
RBC: 5.22 MIL/uL (ref 4.22–5.81)
RDW: 14.1 % (ref 11.5–15.5)
WBC: 14 10*3/uL — ABNORMAL HIGH (ref 4.0–10.5)
nRBC: 0 % (ref 0.0–0.2)

## 2020-05-20 LAB — COMPREHENSIVE METABOLIC PANEL
ALT: 26 U/L (ref 0–44)
AST: 26 U/L (ref 15–41)
Albumin: 4.8 g/dL (ref 3.5–5.0)
Alkaline Phosphatase: 45 U/L (ref 38–126)
Anion gap: 13 (ref 5–15)
BUN: 17 mg/dL (ref 6–20)
CO2: 22 mmol/L (ref 22–32)
Calcium: 10.5 mg/dL — ABNORMAL HIGH (ref 8.9–10.3)
Chloride: 102 mmol/L (ref 98–111)
Creatinine, Ser: 1.11 mg/dL (ref 0.61–1.24)
GFR, Estimated: >60 mL/min (ref >60)
Glucose, Bld: 123 mg/dL — ABNORMAL HIGH (ref 70–99)
Potassium: 4.1 mmol/L (ref 3.5–5.1)
Sodium: 137 mmol/L (ref 135–145)
Total Bilirubin: 1.8 mg/dL — ABNORMAL HIGH (ref 0.3–1.2)
Total Protein: 8 g/dL (ref 6.5–8.1)

## 2020-05-20 LAB — LIPASE, BLOOD: Lipase: 29 U/L (ref 11–51)

## 2020-05-20 MED ORDER — SODIUM CHLORIDE 0.9 % IV SOLN
12.5000 mg | Freq: Once | INTRAVENOUS | Status: AC
  Administered 2020-05-20: 12.5 mg via INTRAVENOUS
  Filled 2020-05-20: qty 0.5

## 2020-05-20 MED ORDER — PANTOPRAZOLE SODIUM 40 MG IV SOLR
40.0000 mg | Freq: Once | INTRAVENOUS | Status: AC
  Administered 2020-05-20: 40 mg via INTRAVENOUS
  Filled 2020-05-20: qty 40

## 2020-05-20 MED ORDER — DIPHENHYDRAMINE HCL 50 MG/ML IJ SOLN
12.5000 mg | Freq: Once | INTRAMUSCULAR | Status: AC
  Administered 2020-05-20: 12.5 mg via INTRAVENOUS
  Filled 2020-05-20: qty 1
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
## 2020-05-20 NOTE — ED Triage Notes (Signed)
Pt presents to the ED with upper quadrant abdominal pain for 2 days with many episodes of vomiting and feeling hot and cold.

## 2020-05-20 NOTE — Discharge Instructions (Signed)
Recommend that you take Nexium twice daily for the next week, then once daily after that. You can add Pepcid twice daily - found over-the-counter. Continue use of your GI cocktail. It is strongly recommended that you stop using marijuana in any quantity.   Follow up with your gastroenterologist for recheck next week.

## 2020-07-29 ENCOUNTER — Other Ambulatory Visit: Payer: Self-pay

## 2020-07-29 ENCOUNTER — Emergency Department (HOSPITAL_COMMUNITY)
Admission: EM | Admit: 2020-07-29 | Discharge: 2020-07-29 | Disposition: A | Discharge status: Home / Self Care | Payer: 59 | Attending: Emergency Medicine | Admitting: Emergency Medicine

## 2020-07-29 ENCOUNTER — Encounter (HOSPITAL_COMMUNITY): Payer: Self-pay

## 2020-07-29 DIAGNOSIS — R112 Nausea with vomiting, unspecified: Secondary | ICD-10-CM | POA: Insufficient documentation

## 2020-07-29 DIAGNOSIS — G8929 Other chronic pain: Secondary | ICD-10-CM | POA: Diagnosis not present

## 2020-07-29 DIAGNOSIS — R1011 Right upper quadrant pain: Secondary | ICD-10-CM | POA: Diagnosis not present

## 2020-07-29 DIAGNOSIS — R1013 Epigastric pain: Secondary | ICD-10-CM | POA: Insufficient documentation

## 2020-07-29 DIAGNOSIS — R6883 Chills (without fever): Secondary | ICD-10-CM | POA: Insufficient documentation

## 2020-07-29 DIAGNOSIS — Z87891 Personal history of nicotine dependence: Secondary | ICD-10-CM | POA: Diagnosis not present

## 2020-07-29 LAB — URINALYSIS, ROUTINE W REFLEX MICROSCOPIC
Bilirubin Urine: NEGATIVE
Glucose, UA: NEGATIVE mg/dL
Hgb urine dipstick: NEGATIVE
Ketones, ur: 5 mg/dL — AB
Leukocytes,Ua: NEGATIVE
Nitrite: NEGATIVE
Protein, ur: 100 mg/dL — AB
Specific Gravity, Urine: 1.031 — ABNORMAL HIGH (ref 1.005–1.030)
pH: 7 (ref 5.0–8.0)

## 2020-07-29 LAB — COMPREHENSIVE METABOLIC PANEL
ALT: 17 U/L (ref 0–44)
AST: 21 U/L (ref 15–41)
Albumin: 4.6 g/dL (ref 3.5–5.0)
Alkaline Phosphatase: 45 U/L (ref 38–126)
Anion gap: 11 (ref 5–15)
BUN: 19 mg/dL (ref 6–20)
CO2: 24 mmol/L (ref 22–32)
Calcium: 9.7 mg/dL (ref 8.9–10.3)
Chloride: 101 mmol/L (ref 98–111)
Creatinine, Ser: 1.08 mg/dL (ref 0.61–1.24)
GFR, Estimated: >60 mL/min (ref >60)
Glucose, Bld: 120 mg/dL — ABNORMAL HIGH (ref 70–99)
Potassium: 3.1 mmol/L — ABNORMAL LOW (ref 3.5–5.1)
Sodium: 136 mmol/L (ref 135–145)
Total Bilirubin: 1.9 mg/dL — ABNORMAL HIGH (ref 0.3–1.2)
Total Protein: 7.7 g/dL (ref 6.5–8.1)

## 2020-07-29 LAB — CBC
HCT: 43.5 % (ref 39.0–52.0)
Hemoglobin: 15 g/dL (ref 13.0–17.0)
MCH: 30.2 pg (ref 26.0–34.0)
MCHC: 34.5 g/dL (ref 30.0–36.0)
MCV: 87.5 fL (ref 80.0–100.0)
Platelets: 284 10*3/uL (ref 150–400)
RBC: 4.97 MIL/uL (ref 4.22–5.81)
RDW: 13.7 % (ref 11.5–15.5)
WBC: 15.1 10*3/uL — ABNORMAL HIGH (ref 4.0–10.5)
nRBC: 0 % (ref 0.0–0.2)

## 2020-07-29 LAB — LIPASE, BLOOD: Lipase: 25 U/L (ref 11–51)

## 2020-07-29 MED ORDER — ONDANSETRON HCL 4 MG/2ML IJ SOLN
4.0000 mg | Freq: Once | INTRAMUSCULAR | Status: AC
  Administered 2020-07-29: 4 mg via INTRAVENOUS
  Filled 2020-07-29: qty 2

## 2020-07-29 MED ORDER — HALOPERIDOL LACTATE 5 MG/ML IJ SOLN
2.0000 mg | Freq: Once | INTRAMUSCULAR | Status: AC
  Administered 2020-07-29: 2 mg via INTRAVENOUS
  Filled 2020-07-29: qty 1

## 2020-07-29 MED ORDER — LIDOCAINE VISCOUS HCL 2 % MT SOLN
15.0000 mL | Freq: Once | OROMUCOSAL | Status: AC
  Administered 2020-07-29: 15 mL via ORAL
  Filled 2020-07-29: qty 15

## 2020-07-29 MED ORDER — SODIUM CHLORIDE 0.9 % IV BOLUS
1000.0000 mL | Freq: Once | INTRAVENOUS | Status: AC
  Administered 2020-07-29: 1000 mL via INTRAVENOUS

## 2020-07-29 MED ORDER — ESOMEPRAZOLE MAGNESIUM 40 MG PO CPDR: 40.0000 mg | DELAYED_RELEASE_CAPSULE | Freq: Every day | ORAL | 0 refills | Status: DC

## 2020-07-29 MED ORDER — ONDANSETRON 8 MG PO TBDP: 8.0000 mg | ORAL_TABLET | Freq: Four times a day (QID) | ORAL | 0 refills | Status: AC

## 2020-07-29 MED ORDER — ALUM & MAG HYDROXIDE-SIMETH 200-200-20 MG/5ML PO SUSP
30.0000 mL | Freq: Once | ORAL | Status: AC
  Administered 2020-07-29: 30 mL via ORAL
  Filled 2020-07-29: qty 30

## 2020-07-29 MED ORDER — FAMOTIDINE 20 MG PO TABS
40.0000 mg | ORAL_TABLET | Freq: Once | ORAL | Status: AC
  Administered 2020-07-29: 40 mg via ORAL

## 2020-07-29 MED ORDER — LIDOCAINE VISCOUS HCL 2 % MT SOLN: 10.0000 mL | Freq: Two times a day (BID) | OROMUCOSAL | 0 refills | Status: DC | PRN

## 2020-07-29 NOTE — ED Provider Notes (Signed)
[facility] EMERGENCY DEPARTMENT Provider Note   CSN: [id number] Arrival date & time: 07/29/20  3976     History No chief complaint on file.   Justin Ferrell is a 30 y.o. male with history of marijuana use, cannabis hyperemesis syndrome, presents to the ED for evaluation of abdominal pain associated with nausea, vomiting, chills.  Onset 3 days ago pain is localized in the epigastric area, constant, moderate.  Sometimes it radiates into the lower central chest.  He has vomited 9 or 10 times so far today.  No hematemesis.  No chest pain or shortness of breath.  No changes in bowel movements.  No hematuria.  Reports intermittent burning with urination.  States that whenever he gets a flare of his stomach pain he will also get burning with urination.  Denies penile discharge, testicular pain, concern for STD.  States he is not sexually active. Gets this along with his abdominal pain all the time.  He has a gastroenterologist through [facility] who has told him this happens because of his marijuana use and things he eats but he doesn't think that is right. He was prescribed nexium and GI cocktail and zofran but he stopped taking this 2-3 months ago because he felt like his symptoms were better.  He started taking his medicines when symptoms started 3 months ago but symptoms are no better. He did have [place] food a few days ago. No NSAID use. Rare occasional alcohol use.  He still smokes marijuana daily, last 2 days ago. No abdominal surgeries.    HPI     History reviewed. No pertinent past medical history.  Patient Active Problem List   Diagnosis Date Noted  . Cannabis hyperemesis syndrome concurrent with and due to cannabis abuse (HCC) 12/22/2019  . Shoulder pain 09/17/2016  . High risk sexual behavior 08/14/2016    History reviewed. No pertinent surgical history.     Family History  Family history unknown: Yes    Social History   Tobacco Use  . Smoking status:  Former Smoker    Types: Cigars  . Smokeless tobacco: Never Used  Vaping Use  . Vaping Use: Never used  Substance Use Topics  . Alcohol use: Never  . Drug use: Yes    Types: Marijuana    Home Medications Prior to Admission medications   Medication Sig Start Date End Date Taking? Authorizing Provider  GI Cocktail (alum & mag hydroxide-simethicone/lidocaine)oral mixture Take 10 mLs by mouth 2 (two) times daily as needed. 07/29/20  Yes Liberty Handy, PA-C  esomeprazole (NEXIUM) 40 MG capsule Take 1 capsule (40 mg total) by mouth daily. 07/29/20   Liberty Handy, PA-C  ondansetron (ZOFRAN-ODT) 8 MG disintegrating tablet Take 1 tablet (8 mg total) by mouth 4 (four) times daily. 07/29/20 08/28/20  Liberty Handy, PA-C  tiZANidine (ZANAFLEX) 2 MG tablet Take 1-2 tablets (2-4 mg total) by mouth every 6 (six) hours as needed for muscle spasms. 03/09/20   Hilts, Casimiro Needle, MD  traZODone (DESYREL) 50 MG tablet Take 1 tablet (50 mg total) by mouth at bedtime. 04/20/20   Grayce Sessions, NP  omeprazole (PRILOSEC) 20 MG capsule Take 20 mg by mouth daily as needed (acid reflux).  09/23/19  [provider]  sucralfate (CARAFATE) 1 GM/10ML suspension Take 10 mLs (1 g total) by mouth 4 (four) times daily -  with meals and at bedtime. 06/20/19 09/23/19  Maxwell Caul, PA-C    Allergies    Metoclopramide  Review of Systems   Review of Systems  Gastrointestinal: Positive for abdominal pain, nausea and vomiting.  All other systems reviewed and are negative.   Physical Exam Updated Vital Signs BP 137/60   Pulse (!) 49   Temp 98.7 F (37.1 C) (Oral)   Resp 19   SpO2 99%   Physical Exam Vitals and nursing note reviewed.  Constitutional:      Appearance: He is well-developed.     Comments: Non toxic.  HENT:     Head: Normocephalic and atraumatic.     Nose: Nose normal.  Eyes:     Conjunctiva/sclera: Conjunctivae normal.  Cardiovascular:     Rate and Rhythm: Normal rate and  regular rhythm.     Heart sounds: Normal heart sounds.  Pulmonary:     Effort: Pulmonary effort is normal.     Breath sounds: Normal breath sounds.  Abdominal:     General: Bowel sounds are normal.     Palpations: Abdomen is soft.     Tenderness: There is abdominal tenderness (epigastric, RUQ, LUF).     Comments: No G/R/R. No suprapubic or CVA tenderness. Negative Murphy's and McBurney's  Musculoskeletal:        General: Normal range of motion.     Cervical back: Normal range of motion.  Skin:    General: Skin is warm and dry.     Capillary Refill: Capillary refill takes less than 2 seconds.  Neurological:     Mental Status: He is alert.  Psychiatric:        Behavior: Behavior normal.     ED Results / Procedures / Treatments   Labs (all labs ordered are listed, but only abnormal results are displayed) Labs Reviewed  COMPREHENSIVE METABOLIC PANEL - Abnormal; Notable for the following components:      Result Value   Potassium 3.1 (*)    Glucose, Bld 120 (*)    Total Bilirubin 1.9 (*)    All other components within normal limits  CBC - Abnormal; Notable for the following components:   WBC 15.1 (*)    All other components within normal limits  URINALYSIS, ROUTINE W REFLEX MICROSCOPIC - Abnormal; Notable for the following components:   Specific Gravity, Urine 1.031 (*)    Ketones, ur 5 (*)    Protein, ur 100 (*)    Bacteria, UA RARE (*)    All other components within normal limits  LIPASE, BLOOD  GC/CHLAMYDIA PROBE AMP ([place]) NOT AT [facility]    EKG None  Radiology No results found.  Procedures Procedures   Medications Ordered in ED Medications  sodium chloride 0.9 % bolus 1,000 mL (1,000 mLs Intravenous New Bag/Given 07/29/20 1114)  ondansetron (ZOFRAN) injection 4 mg (4 mg Intravenous Given 07/29/20 1115)  haloperidol lactate (HALDOL) injection 2 mg (2 mg Intravenous Given 07/29/20 1115)  famotidine (PEPCID) tablet 40 mg (40 mg Oral Given 07/29/20 1116)  alum & mag  hydroxide-simeth (MAALOX/MYLANTA) 200-200-20 MG/5ML suspension 30 mL (30 mLs Oral Given 07/29/20 1116)    And  lidocaine (XYLOCAINE) 2 % viscous mouth solution 15 mL (15 mLs Oral Given 07/29/20 1116)    ED Course  I have reviewed the triage vital signs and the nursing notes.  Pertinent labs & imaging results that were available during my care of the patient were reviewed by me and considered in my medical decision making (see chart for details).    MDM Rules/Calculators/A&P  30 y.o. yo male presents to the ED for recurrent/acute on chronic epigastric abdominal pain, nausea, vomiting.  History of the same.  Sees a gastroenterologist.  Supposed to be on Nexium, Zofran, GI cocktail but stopped taking this 2 to 3 months ago when he felt like his symptoms were better.  Continues to smoke marijuana.  Additional information obtained from chart, nursing and triage notes review  Chart review reveals -recent EGD was unremarkable.  Sees [facility] gastroenterologist.  Frequent ED visits for similar concerns.  Ordered lab, imaging were personally reviewed and interpreted  Labs reveal -WBC 15.1.  K3.1.  Normal LFTs, lipase, creatinine.  5 ketones and 100 protein in the urine.  No signs of infection.  Medicines ordered -GI cocktail, Pepcid, Haldol, Zofran and IV fluids  DD course & MDM  1212: Patient reevaluated after medicines and had significant improvement in symptoms.  He is tolerating fluid challenge.  He actually requested discharge.  Given chronicity of symptoms, improvement in symptoms, overall reassuring work-up feel it is reasonable to defer further emergent imaging or lab work.  Considered pancreatitis, cholecystitis highly unlikely.  Doubt appendicitis, SBO, diverticulitis.  Could be viral etiology.  High suspicion for GERD/gastritis uncontrolled, cannabinoid hyperemesis as previously diagnosed by GI.  I have refilled his GI medicines.  Stressed importance of marijuana  cessation.  Return precautions discussed  Portions of this note were generated with Dragon dictation software. Dictation errors may occur despite best attempts at proofreading    Final Clinical Impression(s) / ED Diagnoses Final diagnoses:  Abdominal pain, chronic, epigastric    Rx / DC Orders ED Discharge Orders         Ordered    esomeprazole (NEXIUM) 40 MG capsule  Daily        07/29/20 1209    ondansetron (ZOFRAN-ODT) 8 MG disintegrating tablet  4 times daily        07/29/20 1209    GI Cocktail (alum & mag hydroxide-simethicone/lidocaine)oral mixture  2 times daily PRN        07/29/20 1209           Liberty Handy, PA-C 07/29/20 1213    Alvira Monday, MD 07/29/20 2302

## 2020-07-29 NOTE — ED Triage Notes (Signed)
Patient complains of 3 days of lower abdominal pain and thinks related to GERD that he hasnt taken meds for several weeks. Denies diarrhea but history of same

## 2020-07-29 NOTE — ED Notes (Signed)
Pt provided water for PO challenge

## 2020-07-29 NOTE — Discharge Instructions (Signed)
You were seen in the ER for abdominal pain. Labs were ok.   I suspect your symptoms may be from gastritis, acid reflux or related to marijuana use.  Resume taking your prescribed Nexium, GI cocktail and zofran daily as prescribed by your gastroenterologist.  Cut back and avoid marijuana.   Avoid irritating foods and liquids such as alcohol, greasy/fatty or acidic foods. Avoid ibuprofen containing products.   Follow up with GI doctor for further management of this if it persits.   Return to the ER for fever, chills, blood in vomit or stool, worsening localized abdominal pain to right upper or right lower abdomen, inability to tolerate fluids.

## 2020-10-22 ENCOUNTER — Other Ambulatory Visit (INDEPENDENT_AMBULATORY_CARE_PROVIDER_SITE_OTHER): Payer: Self-pay | Admitting: Primary Care

## 2020-10-22 NOTE — Telephone Encounter (Signed)
Called pt to make appt and he stated he is no longer taking any meds, including Trazodone. Refused to make appt at this time.

## 2020-10-23 NOTE — Telephone Encounter (Signed)
Sent to PCP to refill if appropriate.  

## 2020-11-17 ENCOUNTER — Emergency Department (HOSPITAL_COMMUNITY)
Admission: EM | Admit: 2020-11-17 | Discharge: 2020-11-17 | Disposition: A | Discharge status: Home / Self Care | Payer: 59 | Attending: Emergency Medicine | Admitting: Emergency Medicine

## 2020-11-17 ENCOUNTER — Encounter (HOSPITAL_COMMUNITY): Payer: Self-pay

## 2020-11-17 ENCOUNTER — Other Ambulatory Visit: Payer: Self-pay

## 2020-11-17 DIAGNOSIS — R1110 Vomiting, unspecified: Secondary | ICD-10-CM | POA: Diagnosis present

## 2020-11-17 DIAGNOSIS — T434X5A Adverse effect of butyrophenone and thiothixene neuroleptics, initial encounter: Secondary | ICD-10-CM | POA: Diagnosis not present

## 2020-11-17 DIAGNOSIS — R1013 Epigastric pain: Secondary | ICD-10-CM

## 2020-11-17 DIAGNOSIS — G2571 Drug induced akathisia: Secondary | ICD-10-CM | POA: Insufficient documentation

## 2020-11-17 DIAGNOSIS — R112 Nausea with vomiting, unspecified: Secondary | ICD-10-CM

## 2020-11-17 DIAGNOSIS — Z87891 Personal history of nicotine dependence: Secondary | ICD-10-CM | POA: Insufficient documentation

## 2020-11-17 HISTORY — DX: Gastro-esophageal reflux disease without esophagitis: K21.9

## 2020-11-17 LAB — URINALYSIS, ROUTINE W REFLEX MICROSCOPIC
Glucose, UA: NEGATIVE mg/dL
Hgb urine dipstick: NEGATIVE
Ketones, ur: 80 mg/dL — AB
Leukocytes,Ua: NEGATIVE
Nitrite: NEGATIVE
Protein, ur: 30 mg/dL — AB
Specific Gravity, Urine: 1.025 (ref 1.005–1.030)
pH: 6 (ref 5.0–8.0)

## 2020-11-17 LAB — COMPREHENSIVE METABOLIC PANEL
ALT: 17 U/L (ref 0–44)
AST: 21 U/L (ref 15–41)
Albumin: 5.2 g/dL — ABNORMAL HIGH (ref 3.5–5.0)
Alkaline Phosphatase: 46 U/L (ref 38–126)
Anion gap: 12 (ref 5–15)
BUN: 26 mg/dL — ABNORMAL HIGH (ref 6–20)
CO2: 24 mmol/L (ref 22–32)
Calcium: 10.6 mg/dL — ABNORMAL HIGH (ref 8.9–10.3)
Chloride: 107 mmol/L (ref 98–111)
Creatinine, Ser: 1.17 mg/dL (ref 0.61–1.24)
GFR, Estimated: >60 mL/min (ref >60)
Glucose, Bld: 108 mg/dL — ABNORMAL HIGH (ref 70–99)
Potassium: 4 mmol/L (ref 3.5–5.1)
Sodium: 143 mmol/L (ref 135–145)
Total Bilirubin: 2.8 mg/dL — ABNORMAL HIGH (ref 0.3–1.2)
Total Protein: 8.7 g/dL — ABNORMAL HIGH (ref 6.5–8.1)

## 2020-11-17 LAB — CBC
HCT: 44.8 % (ref 39.0–52.0)
Hemoglobin: 15.7 g/dL (ref 13.0–17.0)
MCH: 30.3 pg (ref 26.0–34.0)
MCHC: 35 g/dL (ref 30.0–36.0)
MCV: 86.3 fL (ref 80.0–100.0)
Platelets: 265 10*3/uL (ref 150–400)
RBC: 5.19 MIL/uL (ref 4.22–5.81)
RDW: 13.7 % (ref 11.5–15.5)
WBC: 12.8 10*3/uL — ABNORMAL HIGH (ref 4.0–10.5)
nRBC: 0 % (ref 0.0–0.2)

## 2020-11-17 LAB — LIPASE, BLOOD: Lipase: 25 U/L (ref 11–51)

## 2020-11-17 MED ORDER — PANTOPRAZOLE SODIUM 20 MG PO TBEC: 40.0000 mg | DELAYED_RELEASE_TABLET | Freq: Every day | ORAL | 0 refills | Status: DC

## 2020-11-17 MED ORDER — LACTATED RINGERS IV BOLUS
1000.0000 mL | Freq: Once | INTRAVENOUS | Status: AC
  Administered 2020-11-17: 1000 mL via INTRAVENOUS

## 2020-11-17 MED ORDER — HALOPERIDOL LACTATE 5 MG/ML IJ SOLN
4.0000 mg | Freq: Once | INTRAMUSCULAR | Status: AC
  Administered 2020-11-17: 4 mg via INTRAVENOUS
  Filled 2020-11-17: qty 1

## 2020-11-17 MED ORDER — ONDANSETRON 4 MG PO TBDP: 4.0000 mg | ORAL_TABLET | Freq: Three times a day (TID) | ORAL | 0 refills | Status: DC | PRN

## 2020-11-17 MED ORDER — ALUM & MAG HYDROXIDE-SIMETH 200-200-20 MG/5ML PO SUSP
30.0000 mL | Freq: Once | ORAL | Status: AC
  Administered 2020-11-17: 30 mL via ORAL
  Filled 2020-11-17: qty 30

## 2020-11-17 MED ORDER — LIDOCAINE VISCOUS HCL 2 % MT SOLN
15.0000 mL | Freq: Once | OROMUCOSAL | Status: AC
  Administered 2020-11-17: 15 mL via ORAL
  Filled 2020-11-17: qty 15

## 2020-11-17 NOTE — ED Triage Notes (Signed)
Patient c/o epigastric pain, acid reflux, and vomiting x 3 days. Patient states he has not taken his omeprazole x 2-3 months because because his symptoms were better.

## 2020-11-17 NOTE — ED Provider Notes (Signed)
[facility]-EMERGENCY DEPT Provider Note   CSN: [id number] Arrival date & time: 11/17/20  0703     History Chief Complaint  Patient presents with   Emesis   Abdominal Pain    Justin Ferrell is a 30 y.o. male.  HPI    Abdominal pain and vomiting for 2 days 10 times per day emesis Epigastric abdominal pain Anything trying to eat throws it back up Has been off of esomeprazole and GI cocktail Feels hot and cold but haven't measured temperatures No known sick contacts No hematemesis or black/bloody stool No diarrhea, last BM yesterday AM  No urinary symptoms No regular NSAID, etoh use, decreased mj use not recently  Past Medical History:  Diagnosis Date   GERD (gastroesophageal reflux disease)     Patient Active Problem List   Diagnosis Date Noted   Cannabis hyperemesis syndrome concurrent with and due to cannabis abuse (HCC) 12/22/2019   Shoulder pain 09/17/2016   High risk sexual behavior 08/14/2016    History reviewed. No pertinent surgical history.     Family History  Family history unknown: Yes    Social History   Tobacco Use   Smoking status: Former    Types: Cigars   Smokeless tobacco: Never  Vaping Use   Vaping Use: Never used  Substance Use Topics   Alcohol use: Never   Drug use: Yes    Types: Marijuana    Home Medications Prior to Admission medications   Medication Sig Start Date End Date Taking? Authorizing Provider  ondansetron (ZOFRAN ODT) 4 MG disintegrating tablet Take 1 tablet (4 mg total) by mouth every 8 (eight) hours as needed for nausea or vomiting. 11/17/20  Yes Alvira Monday, MD  pantoprazole (PROTONIX) 20 MG tablet Take 2 tablets (40 mg total) by mouth daily for 14 days. 11/17/20 12/01/20 Yes Alvira Monday, MD  traZODone (DESYREL) 50 MG tablet TAKE 1 TABLET BY MOUTH EVERYDAY AT BEDTIME Patient not taking: Reported on 11/17/2020 10/23/20   Grayce Sessions, NP  omeprazole (PRILOSEC) 20 MG capsule  Take 20 mg by mouth daily as needed (acid reflux).  09/23/19  [provider]  sucralfate (CARAFATE) 1 GM/10ML suspension Take 10 mLs (1 g total) by mouth 4 (four) times daily -  with meals and at bedtime. 06/20/19 09/23/19  Maxwell Caul, PA-C    Allergies    Metoclopramide  Review of Systems   Review of Systems  Constitutional:  Positive for fatigue. Negative for fever.  HENT:  Negative for sore throat.   Respiratory:  Negative for cough and shortness of breath (with pain and nausea).   Cardiovascular:  Negative for chest pain.  Gastrointestinal:  Positive for abdominal pain, nausea and vomiting. Negative for constipation and diarrhea.  Genitourinary:  Negative for dysuria.  Musculoskeletal:  Positive for back pain.  Skin:  Negative for rash.  Neurological:  Positive for light-headedness. Negative for headaches.   Physical Exam Updated Vital Signs BP 138/78   Pulse 66   Temp 98.5 F (36.9 C) (Oral)   Resp 16   Ht 6' 2.5" (1.892 m)   Wt 80.3 kg   SpO2 100%   BMI 22.42 kg/m   Physical Exam Vitals and nursing note reviewed.  Constitutional:      General: He is not in acute distress.    Appearance: He is well-developed. He is not diaphoretic.  HENT:     Head: Normocephalic and atraumatic.  Eyes:     Conjunctiva/sclera: Conjunctivae normal.  Cardiovascular:     Rate and Rhythm: Normal rate and regular rhythm.     Heart sounds: Normal heart sounds. No murmur heard.   No friction rub. No gallop.  Pulmonary:     Effort: Pulmonary effort is normal. No respiratory distress.     Breath sounds: Normal breath sounds. No wheezing or rales.  Abdominal:     General: There is no distension.     Palpations: Abdomen is soft.     Tenderness: There is no abdominal tenderness. There is no guarding.  Musculoskeletal:     Cervical back: Normal range of motion.  Skin:    General: Skin is warm and dry.  Neurological:     Mental Status: He is alert and oriented to person,  place, and time.    ED Results / Procedures / Treatments   Labs (all labs ordered are listed, but only abnormal results are displayed) Labs Reviewed  COMPREHENSIVE METABOLIC PANEL - Abnormal; Notable for the following components:      Result Value   Glucose, Bld 108 (*)    BUN 26 (*)    Calcium 10.6 (*)    Total Protein 8.7 (*)    Albumin 5.2 (*)    Total Bilirubin 2.8 (*)    All other components within normal limits  CBC - Abnormal; Notable for the following components:   WBC 12.8 (*)    All other components within normal limits  URINALYSIS, ROUTINE W REFLEX MICROSCOPIC - Abnormal; Notable for the following components:   Bilirubin Urine MODERATE (*)    Ketones, ur 80 (*)    Protein, ur 30 (*)    Bacteria, UA RARE (*)    All other components within normal limits  LIPASE, BLOOD    EKG EKG Interpretation  Date/Time:  Friday November 17 2020 10:44:33 EDT Ventricular Rate:  67 PR Interval:  224 QRS Duration: 108 QT Interval:  426 QTC Calculation: 450 R Axis:   90 Text Interpretation: Sinus rhythm Prolonged PR interval Consider right ventricular hypertrophy Probable left ventricular hypertrophy Nonspecific T abnrm, anterolateral leads ST elev, probable normal early repol pattern Artifact, No significant change since last tracing Confirmed by Alvira Monday ([id number]) on 11/18/2020 7:27:29 AM  Radiology No results found.  Procedures Procedures   Medications Ordered in ED Medications  haloperidol lactate (HALDOL) injection 4 mg (4 mg Intravenous Given 11/17/20 0951)  lactated ringers bolus 1,000 mL (0 mLs Intravenous Stopped 11/17/20 1055)  alum & mag hydroxide-simeth (MAALOX/MYLANTA) 200-200-20 MG/5ML suspension 30 mL (30 mLs Oral Given 11/17/20 1050)    And  lidocaine (XYLOCAINE) 2 % viscous mouth solution 15 mL (15 mLs Oral Given 11/17/20 1050)    ED Course  I have reviewed the triage vital signs and the nursing notes.  Pertinent labs & imaging results that were  available during my care of the patient were reviewed by me and considered in my medical decision making (see chart for details).    MDM Rules/Calculators/A&P                           30 year old male with history of episodes of nausea and vomiting, GERD, suspected cannabinoid hyperemesis, presents with concern for abdominal pain, nausea and vomiting.  Labs do not show signs of pancreatitis, hepatitis, or other significant electrolyte abnormalities.  Urinalysis shows no signs of infection, but is consistent with dehydration and shows starvation ketosis.  No hyperglycemia or history of diabetes to suggest DKA.  Exam and history are not consistent with appendicitis, cholecystitis, small bowel obstruction, diverticulitis.  Denies recent marijuana use, but consider cannabinoid hyperemesis, other cyclic vomiting, gastritis or gastroparesis.  Given IV fluids, Haldol was given. Developed some akathisia and reports he no longer wants to stay in the emergency department.  Offered benadryl but he declines. EKG shows no arrhythmia.  He is not actively vomiting.  Feel he is stable for outpatient follow up, given rx for protonix, zofran. Patient discharged in stable condition with understanding of reasons to return.    Final Clinical Impression(s) / ED Diagnoses Final diagnoses:  Epigastric pain  Non-intractable vomiting with nausea, unspecified vomiting type    Rx / DC Orders ED Discharge Orders          Ordered    ondansetron (ZOFRAN ODT) 4 MG disintegrating tablet  Every 8 hours PRN        11/17/20 1049    pantoprazole (PROTONIX) 20 MG tablet  Daily        11/17/20 1049             Alvira Monday, MD 11/18/20 [phone]

## 2020-11-29 ENCOUNTER — Ambulatory Visit (INDEPENDENT_AMBULATORY_CARE_PROVIDER_SITE_OTHER): Payer: Self-pay | Admitting: *Deleted

## 2020-11-29 NOTE — Telephone Encounter (Signed)
Reason for Disposition  [1] SEVERE abdominal pain AND [2] present > 1 hour  Answer Assessment - Initial Assessment Questions 1. ONSET: "When did the pain begin?"      1 1/2 weeks 2. LOCATION: "Where does it hurt?" (upper, mid or lower back)     Lower back-into hips 3. SEVERITY: "How bad is the pain?"  (e.g., Scale 1-10; mild, moderate, or severe)   - MILD (1-3): doesn't interfere with normal activities    - MODERATE (4-7): interferes with normal activities or awakens from sleep    - SEVERE (8-10): excruciating pain, unable to do any normal activities      severe 4. PATTERN: "Is the pain constant?" (e.g., yes, no; constant, intermittent)      constant 5. RADIATION: "Does the pain shoot into your legs or elsewhere?"     Into hips/sides 6. CAUSE:  "What do you think is causing the back pain?"      unsure 7. BACK OVERUSE:  "Any recent lifting of heavy objects, strenuous work or exercise?"     no 8. MEDICATIONS: "What have you taken so far for the pain?" (e.g., nothing, acetaminophen, NSAIDS)     ibuprofen 9. NEUROLOGIC SYMPTOMS: "Do you have any weakness, numbness, or problems with bowel/bladder control?"     Yes- painful to move bowels 10. OTHER SYMPTOMS: "Do you have any other symptoms?" (e.g., fever, abdominal pain, burning with urination, blood in urine)       no 11. PREGNANCY: "Is there any chance you are pregnant?" (e.g., yes, no; LMP)       N/a  Protocols used: Back Pain-A-AH

## 2020-11-30 ENCOUNTER — Other Ambulatory Visit: Payer: Self-pay

## 2020-11-30 ENCOUNTER — Emergency Department (HOSPITAL_COMMUNITY)
Admission: EM | Admit: 2020-11-30 | Discharge: 2020-11-30 | Disposition: A | Discharge status: Home / Self Care | Payer: 59 | Attending: Emergency Medicine | Admitting: Emergency Medicine

## 2020-11-30 ENCOUNTER — Encounter (HOSPITAL_COMMUNITY): Payer: Self-pay | Admitting: Emergency Medicine

## 2020-11-30 DIAGNOSIS — Z5321 Procedure and treatment not carried out due to patient leaving prior to being seen by health care provider: Secondary | ICD-10-CM | POA: Insufficient documentation

## 2020-11-30 DIAGNOSIS — M545 Low back pain, unspecified: Secondary | ICD-10-CM | POA: Insufficient documentation
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
## 2020-11-30 NOTE — ED Triage Notes (Signed)
Patient coming from home, complaint of back pain that began approx 2 weeks ago, pt does not remember hurting his back states pain just came on suddenly.

## 2020-11-30 NOTE — ED Notes (Signed)
Called pts name 3x to be roomed with no response.  

## 2020-12-06 ENCOUNTER — Emergency Department (HOSPITAL_COMMUNITY)
Admission: EM | Admit: 2020-12-06 | Discharge: 2020-12-06 | Discharge status: Against Medical Advice | Payer: 59 | Attending: Emergency Medicine | Admitting: Emergency Medicine

## 2020-12-06 ENCOUNTER — Emergency Department (HOSPITAL_COMMUNITY): Payer: 59

## 2020-12-06 ENCOUNTER — Other Ambulatory Visit: Payer: Self-pay

## 2020-12-06 ENCOUNTER — Encounter (HOSPITAL_COMMUNITY): Payer: Self-pay

## 2020-12-06 DIAGNOSIS — Z87891 Personal history of nicotine dependence: Secondary | ICD-10-CM | POA: Diagnosis not present

## 2020-12-06 DIAGNOSIS — F1210 Cannabis abuse, uncomplicated: Secondary | ICD-10-CM | POA: Insufficient documentation

## 2020-12-06 DIAGNOSIS — K219 Gastro-esophageal reflux disease without esophagitis: Secondary | ICD-10-CM | POA: Diagnosis present

## 2020-12-06 DIAGNOSIS — R1013 Epigastric pain: Secondary | ICD-10-CM

## 2020-12-06 LAB — CBC
HCT: 42.9 % (ref 39.0–52.0)
Hemoglobin: 15.3 g/dL (ref 13.0–17.0)
MCH: 30.7 pg (ref 26.0–34.0)
MCHC: 35.7 g/dL (ref 30.0–36.0)
MCV: 86 fL (ref 80.0–100.0)
Platelets: 286 10*3/uL (ref 150–400)
RBC: 4.99 MIL/uL (ref 4.22–5.81)
RDW: 13.8 % (ref 11.5–15.5)
WBC: 11.8 10*3/uL — ABNORMAL HIGH (ref 4.0–10.5)
nRBC: 0 % (ref 0.0–0.2)

## 2020-12-06 LAB — BASIC METABOLIC PANEL
Anion gap: 12 (ref 5–15)
BUN: 20 mg/dL (ref 6–20)
CO2: 22 mmol/L (ref 22–32)
Calcium: 10.3 mg/dL (ref 8.9–10.3)
Chloride: 104 mmol/L (ref 98–111)
Creatinine, Ser: 1.02 mg/dL (ref 0.61–1.24)
GFR, Estimated: >60 mL/min (ref >60)
Glucose, Bld: 110 mg/dL — ABNORMAL HIGH (ref 70–99)
Potassium: 4 mmol/L (ref 3.5–5.1)
Sodium: 138 mmol/L (ref 135–145)

## 2020-12-06 LAB — TROPONIN I (HIGH SENSITIVITY)
Troponin I (High Sensitivity): 4 ng/L (ref <18)
Troponin I (High Sensitivity): 3 ng/L (ref <18)

## 2020-12-06 IMAGING — DX DG CHEST 1V PORT
2 series · 2 of 2 positions shown · non-contrast
Comparison: Chest radiograph, [DATE]

CLINICAL DATA: Chest pain.

EXAM:
PORTABLE CHEST 1 VIEW

[chest ap (1 of 2)]
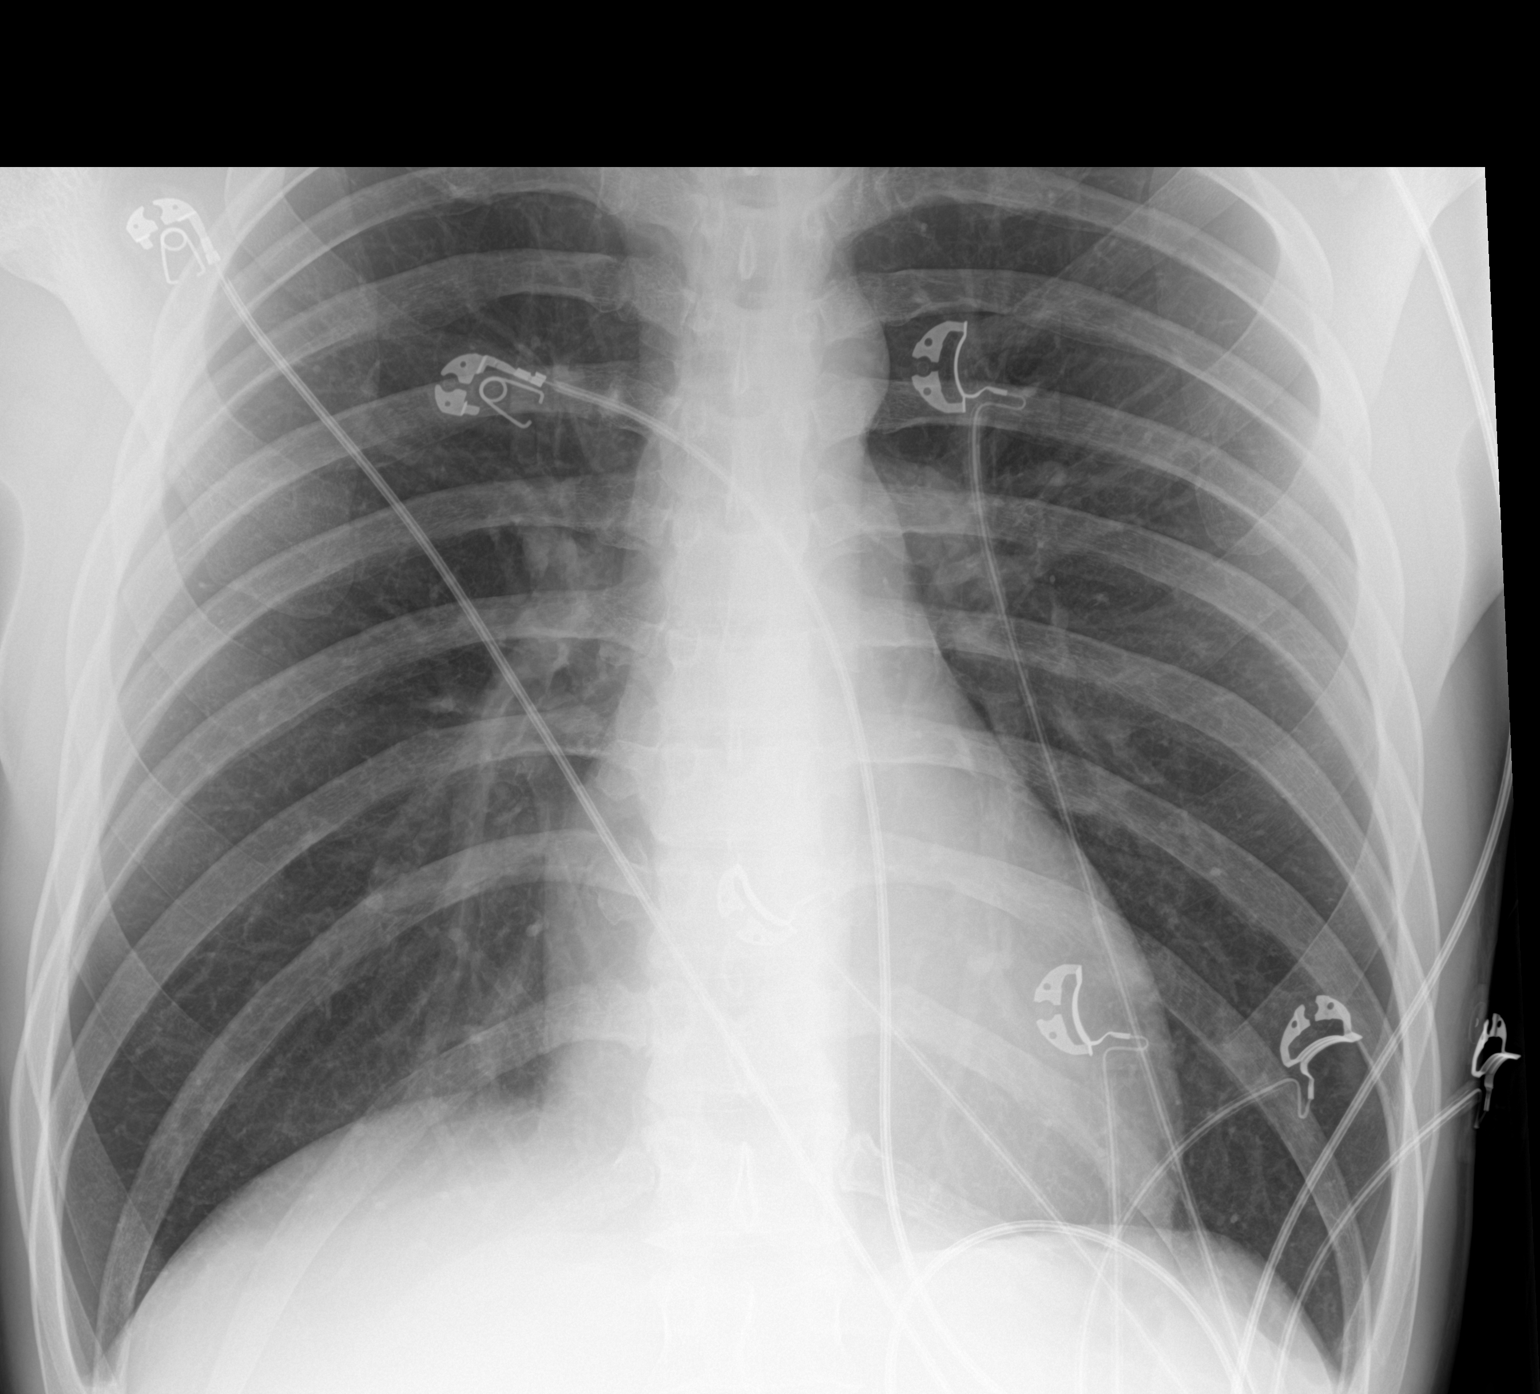

[chest ap (2 of 2)]
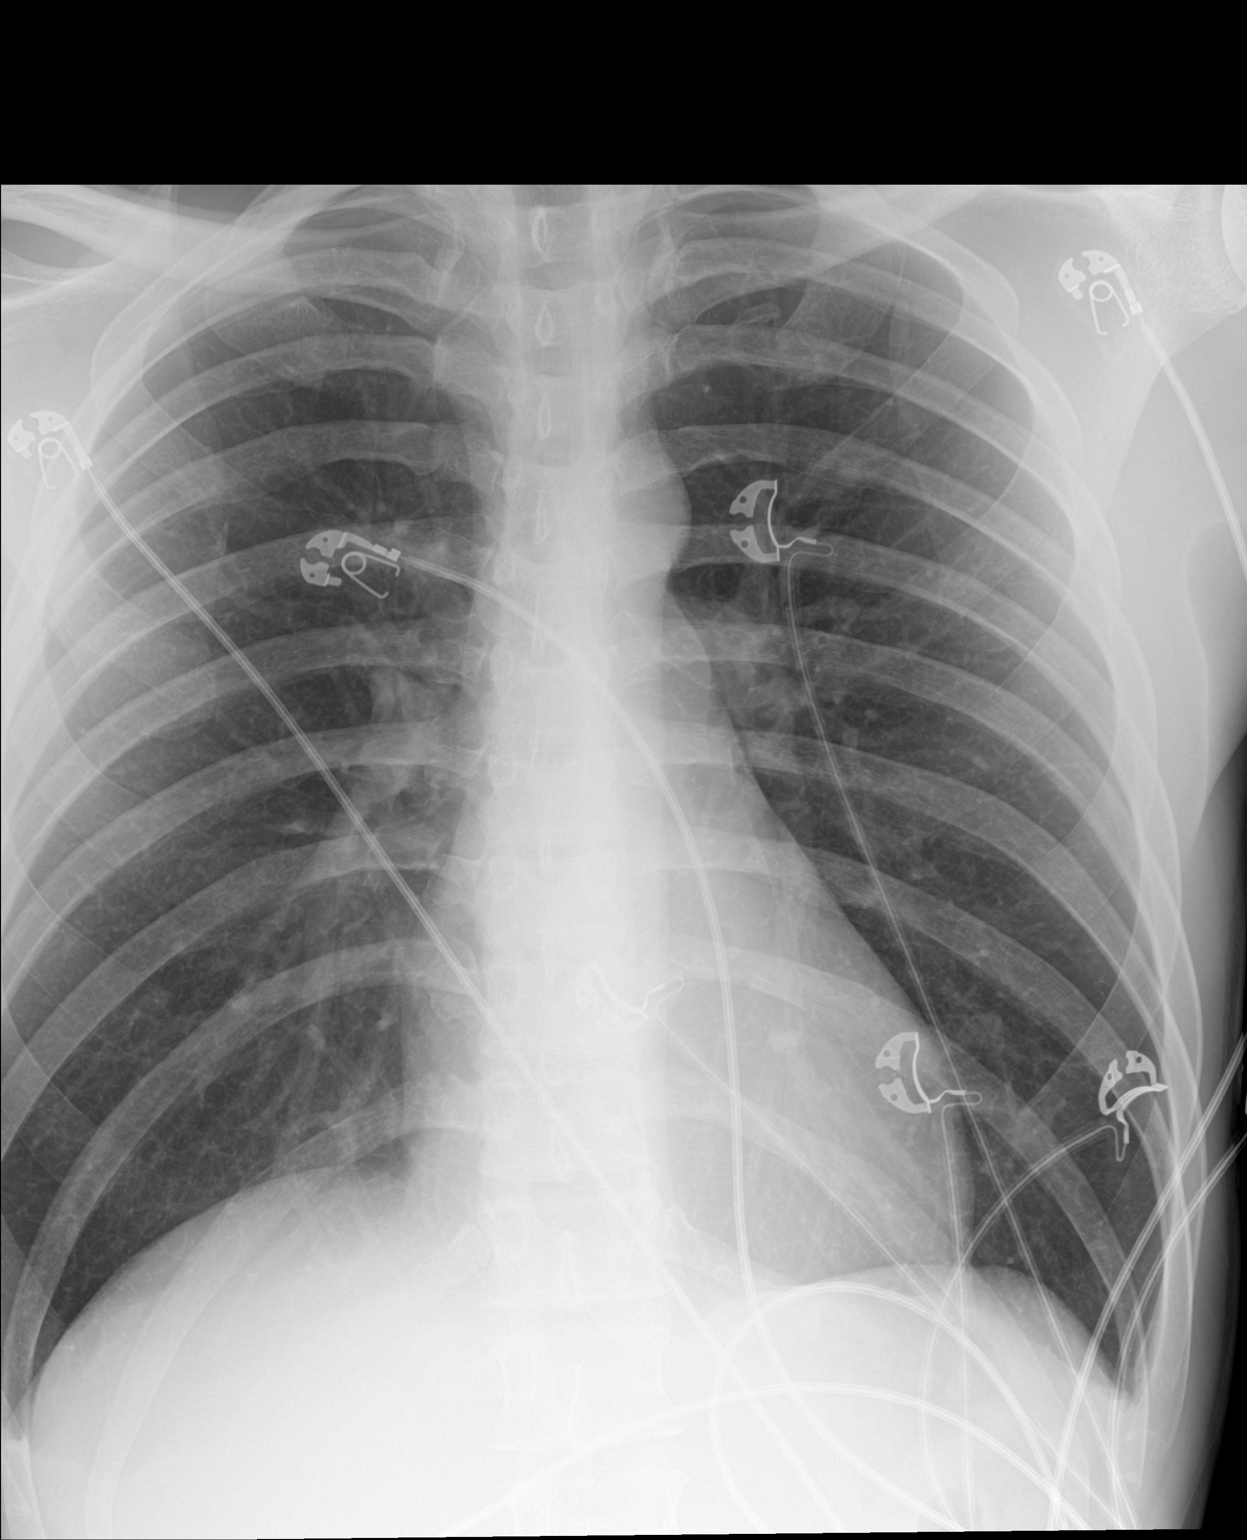

[2 of 2 positions shown; findings below may reference images not displayed]

FINDINGS: The heart size and mediastinal contours are within normal limits.
Both lungs are clear. The visualized skeletal structures are
unremarkable.
IMPRESSION: Normal chest.

## 2020-12-06 MED ORDER — LIDOCAINE VISCOUS HCL 2 % MT SOLN: 15.0000 mL | Freq: Once | OROMUCOSAL | Status: DC

## 2020-12-06 MED ORDER — ALUM & MAG HYDROXIDE-SIMETH 200-200-20 MG/5ML PO SUSP: 30.0000 mL | Freq: Once | ORAL | Status: DC

## 2020-12-06 NOTE — ED Provider Notes (Signed)
[facility]-EMERGENCY DEPT Provider Note   CSN: [id number] Arrival date & time: 12/06/20  6270     History Chief Complaint  Patient presents with   Gastroesophageal Reflux   Emesis    Justin Ferrell is a 30 y.o. male.  The history is provided by the patient and medical records. No language interpreter was used.  Gastroesophageal Reflux  Emesis  30 year old male with significant history GERD, and marijuana abuse who presents complaining of chest pain.  Patient states for the past 2 weeks he has had recurrent midsternal chest pain.  Described as a burning sensation, with associated nausea and vomiting and having difficulty keeping his food down.  States anything that he eats and drinks aggravates his esophagus and causing pain.  Currently rates pain as 10 out of 10 which has persisted throughout the night.  Despite taking omeprazole as prescribed, he reported minimal improvement.  He admits to marijuana use but denies alcohol use.  He denies cocaine use or other street drug use.  Report having been seen by GI specialist and has had endoscopy without any definitive diagnosis.  No fever or chills no shortness of breath or rash.  No recent injury.  Past Medical History:  Diagnosis Date   GERD (gastroesophageal reflux disease)     Patient Active Problem List   Diagnosis Date Noted   Cannabis hyperemesis syndrome concurrent with and due to cannabis abuse (HCC) 12/22/2019   Shoulder pain 09/17/2016   High risk sexual behavior 08/14/2016    History reviewed. No pertinent surgical history.     Family History  Family history unknown: Yes    Social History   Tobacco Use   Smoking status: Former    Types: Cigars   Smokeless tobacco: Never  Vaping Use   Vaping Use: Never used  Substance Use Topics   Alcohol use: Never   Drug use: Yes    Types: Marijuana    Home Medications Prior to Admission medications   Medication Sig Start Date End Date  Taking? Authorizing Provider  ondansetron (ZOFRAN ODT) 4 MG disintegrating tablet Take 1 tablet (4 mg total) by mouth every 8 (eight) hours as needed for nausea or vomiting. 11/17/20   Alvira Monday, MD  pantoprazole (PROTONIX) 20 MG tablet Take 2 tablets (40 mg total) by mouth daily for 14 days. 11/17/20 12/01/20  Alvira Monday, MD  traZODone (DESYREL) 50 MG tablet TAKE 1 TABLET BY MOUTH EVERYDAY AT BEDTIME Patient not taking: Reported on 11/17/2020 10/23/20   Grayce Sessions, NP  omeprazole (PRILOSEC) 20 MG capsule Take 20 mg by mouth daily as needed (acid reflux).  09/23/19  [provider]  sucralfate (CARAFATE) 1 GM/10ML suspension Take 10 mLs (1 g total) by mouth 4 (four) times daily -  with meals and at bedtime. 06/20/19 09/23/19  Maxwell Caul, PA-C    Allergies    Metoclopramide  Review of Systems   Review of Systems  Gastrointestinal:  Positive for vomiting.  All other systems reviewed and are negative.  Physical Exam Updated Vital Signs BP (!) 115/93 (BP Location: Left Arm)   Pulse 74   Temp 98.6 F (37 C) (Oral)   Resp 16   Ht 6' 2.5" (1.892 m)   Wt 78 kg   SpO2 100%   BMI 21.79 kg/m   Physical Exam Vitals and nursing note reviewed.  Constitutional:      General: He is not in acute distress.    Appearance: He is well-developed.  HENT:     Head: Atraumatic.  Eyes:     Conjunctiva/sclera: Conjunctivae normal.  Cardiovascular:     Rate and Rhythm: Normal rate and regular rhythm.     Pulses: Normal pulses.     Heart sounds: Normal heart sounds.  Pulmonary:     Effort: Pulmonary effort is normal.     Breath sounds: Normal breath sounds. No wheezing, rhonchi or rales.  Abdominal:     Palpations: Abdomen is soft.     Tenderness: There is no abdominal tenderness. There is no guarding.  Musculoskeletal:     Cervical back: Neck supple.  Skin:    General: Skin is warm.     Capillary Refill: Capillary refill takes less than 2 seconds.     Findings:  No rash.  Neurological:     General: No focal deficit present.     Mental Status: He is alert.    ED Results / Procedures / Treatments   Labs (all labs ordered are listed, but only abnormal results are displayed) Labs Reviewed  BASIC METABOLIC PANEL - Abnormal; Notable for the following components:      Result Value   Glucose, Bld 110 (*)    All other components within normal limits  CBC - Abnormal; Notable for the following components:   WBC 11.8 (*)    All other components within normal limits  RAPID URINE DRUG SCREEN, HOSP PERFORMED  TROPONIN I (HIGH SENSITIVITY)  TROPONIN I (HIGH SENSITIVITY)    EKG EKG Interpretation  Date/Time:  Wednesday December 06 2020 08:23:56 EDT Ventricular Rate:  67 PR Interval:  218 QRS Duration: 101 QT Interval:  410 QTC Calculation: 433 R Axis:   88 Text Interpretation: Sinus rhythm Prolonged PR interval Likely early repol pattern ST elevations, with n o sig change from prior tracing Sept 23 2022 Confirmed by Alvester Chou [phone]) on 12/06/2020 8:30:04 AM  Radiology DG Chest Port 1 View  Result Date: 12/06/2020 CLINICAL DATA:  Chest pain. EXAM: PORTABLE CHEST 1 VIEW COMPARISON:  Chest radiograph, 12/04/2023 FINDINGS: The heart size and mediastinal contours are within normal limits. Both lungs are clear. The visualized skeletal structures are unremarkable. IMPRESSION: Normal chest. Electronically Signed   By: Roanna Banning M.D.   On: 12/06/2020 08:46    Procedures Procedures   Medications Ordered in ED Medications  alum & mag hydroxide-simeth (MAALOX/MYLANTA) 200-200-20 MG/5ML suspension 30 mL (has no administration in time range)    And  lidocaine (XYLOCAINE) 2 % viscous mouth solution 15 mL (has no administration in time range)    ED Course  I have reviewed the triage vital signs and the nursing notes.  Pertinent labs & imaging results that were available during my care of the patient were reviewed by me and considered in my  medical decision making (see chart for details).    MDM Rules/Calculators/A&P                           BP (!) 115/93 (BP Location: Left Arm)   Pulse 74   Temp 98.6 F (37 C) (Oral)   Resp 16   Ht 6' 2.5" (1.892 m)   Wt 78 kg   SpO2 100%   BMI 21.79 kg/m   Final Clinical Impression(s) / ED Diagnoses Final diagnoses:  Epigastric pain    Rx / DC Orders ED Discharge Orders     None      8:11 AM Patient complaining of pain that  felt similar to his GERD however not improved despite taking Prilosec.  He also has had significant history of marijuana abuse which could contribute to his current presentation.  Work-up initiated, will provide symptomatic treatment.  Unfortunately pt left AMA.   Fayrene Helper, PA-C 12/06/20 1524    Terald Sleeper, MD 12/20/20 1001

## 2020-12-06 NOTE — ED Triage Notes (Signed)
Patient states, "I have reflux and I have been having this for 2 years." Patient states worse in the past week.  Patient states he has been taking zofran and omeprazole with no relief.

## 2020-12-06 NOTE — ED Notes (Signed)
Pt called x3 in ED lobby. Currently not present and did not respond to name call at noted times, triage RN notified.

## 2021-01-05 ENCOUNTER — Other Ambulatory Visit (INDEPENDENT_AMBULATORY_CARE_PROVIDER_SITE_OTHER): Payer: Self-pay | Admitting: Primary Care

## 2021-01-05 NOTE — Telephone Encounter (Signed)
Medication Refill - Medication: pantoprazole (PROTONIX) 20 MG tablet ondansetron (ZOFRAN ODT) 4 MG disintegrating tablet   Pt is completely out of his current supply    Has the patient contacted their pharmacy? Yes.   (Agent: If no, request that the patient contact the pharmacy for the refill. If patient does not wish to contact the pharmacy document the reason why and proceed with request.) (Agent: If yes, when and what did the pharmacy advise?)  Preferred Pharmacy (with phone number or street name):  [phone] [place] [place] - [address]  [address] [place] [address]  Phone: [phone] Fax: [phone]   Has the patient been seen for an appointment in the last year OR does the patient have an upcoming appointment? Yes.    Agent: Please be advised that RX refills may take up to 3 business days. We ask that you follow-up with your pharmacy.

## 2021-01-06 NOTE — Telephone Encounter (Signed)
Requested medications are on the active medication list yes  Last visit 04/20/20  Future visit scheduled no  Notes to clinic meds ordered in ED 12/06/20. Requested Prescriptions  Pending Prescriptions Disp Refills   ondansetron (ZOFRAN ODT) 4 MG disintegrating tablet 20 tablet 0    Sig: Take 1 tablet (4 mg total) by mouth every 8 (eight) hours as needed for nausea or vomiting.     Not Delegated - Gastroenterology: Antiemetics Failed - 01/05/2021  3:29 PM      Failed - This refill cannot be delegated      Failed - Valid encounter within last 6 months    Recent Outpatient Visits           8 months ago Insomnia, unspecified type   [facility] Grayce Sessions, NP   11 months ago Encounter to establish care   [facility] Gwinda Passe P, NP               pantoprazole (PROTONIX) 20 MG tablet 28 tablet 0    Sig: Take 2 tablets (40 mg total) by mouth daily for 14 days.     Gastroenterology: Proton Pump Inhibitors Passed - 01/05/2021  3:29 PM      Passed - Valid encounter within last 12 months    Recent Outpatient Visits           8 months ago Insomnia, unspecified type   [facility] Grayce Sessions, NP   11 months ago Encounter to establish care   [facility] Grayce Sessions, NP

## 2021-01-06 NOTE — Telephone Encounter (Signed)
   Requested medications are on the active medication list yes  Last visit 04/20/20, these meds were ordered from ED visit.  Future visit scheduled no  Notes to clinic prescriber not in this practice, ordered from ED visit, please assess. Requested Prescriptions  Pending Prescriptions Disp Refills   ondansetron (ZOFRAN ODT) 4 MG disintegrating tablet 20 tablet 0    Sig: Take 1 tablet (4 mg total) by mouth every 8 (eight) hours as needed for nausea or vomiting.     Not Delegated - Gastroenterology: Antiemetics Failed - 01/05/2021  3:29 PM      Failed - This refill cannot be delegated      Failed - Valid encounter within last 6 months    Recent Outpatient Visits           8 months ago Insomnia, unspecified type   [facility] Grayce Sessions, NP   11 months ago Encounter to establish care   [facility] Gwinda Passe P, NP               pantoprazole (PROTONIX) 20 MG tablet 28 tablet 0    Sig: Take 2 tablets (40 mg total) by mouth daily for 14 days.     Gastroenterology: Proton Pump Inhibitors Passed - 01/05/2021  3:29 PM      Passed - Valid encounter within last 12 months    Recent Outpatient Visits           8 months ago Insomnia, unspecified type   [facility] Grayce Sessions, NP   11 months ago Encounter to establish care   [facility] Grayce Sessions, NP

## 2021-01-09 NOTE — Telephone Encounter (Signed)
Pt is completely out of his medication and has been since he requested it on 01/05/2021. Is upset that he still does not have his medications, please advise    [id number] [place], [place] - [address]  [address] [place] [address]  Phone: [phone] Fax: [phone]   Now wants his Rxs sent to pharmacy listed above instead of [place]. Please advise

## 2021-01-09 NOTE — Telephone Encounter (Signed)
Requested medication (s) are due for refill today: yes both  Requested medication (s) are on the active medication list: yes  Last refill:  both meds in ED 11/17/20  #20 for Zofran  #28 for protonix  Future visit scheduled no  Notes to clinic: Historical provider. Please review.  Requested Prescriptions  Pending Prescriptions Disp Refills   ondansetron (ZOFRAN ODT) 4 MG disintegrating tablet 20 tablet 0    Sig: Take 1 tablet (4 mg total) by mouth every 8 (eight) hours as needed for nausea or vomiting.     Not Delegated - Gastroenterology: Antiemetics Failed - 01/09/2021 11:11 AM      Failed - This refill cannot be delegated      Failed - Valid encounter within last 6 months    Recent Outpatient Visits           8 months ago Insomnia, unspecified type   [facility] Grayce Sessions, NP   11 months ago Encounter to establish care   [facility] Gwinda Passe P, NP               pantoprazole (PROTONIX) 20 MG tablet 28 tablet 0    Sig: Take 2 tablets (40 mg total) by mouth daily for 14 days.     Gastroenterology: Proton Pump Inhibitors Passed - 01/09/2021 11:11 AM      Passed - Valid encounter within last 12 months    Recent Outpatient Visits           8 months ago Insomnia, unspecified type   [facility] Grayce Sessions, NP   11 months ago Encounter to establish care   [facility] Grayce Sessions, NP

## 2021-01-10 NOTE — Telephone Encounter (Signed)
Pt scheduled next available appt, is upset that his medications were denied. Wants enough to last him until his scheduled time.  [id number] [place], [place] - [address]  [address] [place] [address]  Phone: [phone] Fax: [phone]

## 2021-01-31 ENCOUNTER — Ambulatory Visit (INDEPENDENT_AMBULATORY_CARE_PROVIDER_SITE_OTHER): Payer: 59 | Admitting: Primary Care

## 2021-02-21 ENCOUNTER — Encounter (INDEPENDENT_AMBULATORY_CARE_PROVIDER_SITE_OTHER): Payer: Self-pay | Admitting: Primary Care

## 2021-02-21 ENCOUNTER — Ambulatory Visit (INDEPENDENT_AMBULATORY_CARE_PROVIDER_SITE_OTHER): Payer: 59 | Admitting: Primary Care

## 2021-02-21 ENCOUNTER — Other Ambulatory Visit: Payer: Self-pay

## 2021-02-21 VITALS — BP 133/73 | HR 64 | Temp 97.9°F | Ht 75.0 in | Wt 183.8 lb

## 2021-02-21 DIAGNOSIS — K2100 Gastro-esophageal reflux disease with esophagitis, without bleeding: Secondary | ICD-10-CM | POA: Diagnosis not present

## 2021-02-21 MED ORDER — ESOMEPRAZOLE MAGNESIUM 40 MG PO CPDR: 40.0000 mg | DELAYED_RELEASE_CAPSULE | Freq: Every morning | ORAL | 1 refills | Status: DC

## 2021-02-21 NOTE — Patient Instructions (Signed)
Why should you not take PPIs long-term? Long-term PPI (omeprazole, Protonix, Nexium ) use has been associated with an increased risk of several adverse outcomes in observational studies, including bone fractures, vitamin B12, magnesium or iron deficiency, Clostridium difficile infection and community-acquired pneumonia.  

## 2021-02-21 NOTE — Progress Notes (Signed)
Sharp pain on back of neck

## 2021-02-21 NOTE — Progress Notes (Signed)
Acute Office Visit  Subjective:    Patient ID: Justin Ferrell, male    DOB: October 15, 1990, 30 y.o.   MRN: [id number]  Chief Complaint  Patient presents with   Medication Refill    Medication Refill Associated symptoms include neck pain.  Justin Ferrell is a 30 year old male in today for acute neck and shoulder pain-asked patient has he done anything different trauma or strain stated no he has been shooting hoops with some teenagers explained that is where the pain is coming from.  He also has a history of GERD and requesting a refill on omeprazole.  Past Medical History:  Diagnosis Date   GERD (gastroesophageal reflux disease)     No past surgical history on file.  Family History  Family history unknown: Yes    Social History   Socioeconomic History   Marital status: Single    Spouse name: Not on file   Number of children: Not on file   Years of education: Not on file   Highest education level: Not on file  Occupational History   Not on file  Tobacco Use   Smoking status: Former    Types: Cigars   Smokeless tobacco: Never  Vaping Use   Vaping Use: Never used  Substance and Sexual Activity   Alcohol use: Never   Drug use: Yes    Types: Marijuana   Sexual activity: Not Currently  Other Topics Concern   Not on file  Social History Narrative   Not on file   Social Determinants of Health   Financial Resource Strain: Not on file  Food Insecurity: Not on file  Transportation Needs: Not on file  Physical Activity: Not on file  Stress: Not on file  Social Connections: Not on file  Intimate Partner Violence: Not on file    Outpatient Medications Prior to Visit  Medication Sig Dispense Refill   ondansetron (ZOFRAN ODT) 4 MG disintegrating tablet Take 1 tablet (4 mg total) by mouth every 8 (eight) hours as needed for nausea or vomiting. 20 tablet 0   traZODone (DESYREL) 50 MG tablet TAKE 1 TABLET BY MOUTH EVERYDAY AT BEDTIME 90 tablet 1   esomeprazole  (NEXIUM) 40 MG capsule Take 40 mg by mouth every morning.     pantoprazole (PROTONIX) 20 MG tablet Take 2 tablets (40 mg total) by mouth daily for 14 days. 28 tablet 0   No facility-administered medications prior to visit.    Allergies  Allergen Reactions   Metoclopramide Anxiety    Review of Systems  Gastrointestinal:        GERD  Musculoskeletal:  Positive for neck pain.       Shoulder pain " shooting hoops with teens"  All other systems reviewed and are negative.     Objective:   BP 133/73 (BP Location: Right Arm, Patient Position: Sitting, Cuff Size: Normal)    Pulse 64    Temp 97.9 F (36.6 C) (Temporal)    Ht 6\' 3"  (1.905 m)    Wt 183 lb 12.8 oz (83.4 kg)    SpO2 95%    BMI 22.97 kg/m  Wt Readings from Last 3 Encounters:  02/21/21 183 lb 12.8 oz (83.4 kg)  12/06/20 172 lb (78 kg)  11/17/20 177 lb (80.3 kg)  Physical exam: General: Vital signs reviewed.  Patient is well-developed and well-nourished, under weight in no acute distress and cooperative with exam. Head: Normocephalic and atraumatic. Eyes: EOMI, conjunctivae normal, no scleral icterus. Neck: Supple, trachea  midline, normal ROM, no JVD, masses, thyromegaly, or carotid bruit present. Cardiovascular: RRR, S1 normal, S2 normal, no murmurs, gallops, or rubs. Pulmonary/Chest: Clear to auscultation bilaterally, no wheezes, rales, or rhonchi. Abdominal: Soft, non-tender, non-distended, BS +, no masses, organomegaly, or guarding present. Musculoskeletal: No joint deformities, erythema, or stiffness, ROM full and nontender. Extremities: No lower extremity edema bilaterally,  pulses symmetric and intact bilaterally. No cyanosis or clubbing. Neurological: A&O x3, Strength is normal Skin: Warm, dry and intact. No rashes or erythema. Psychiatric: Normal mood and affect. speech and behavior is normal. Cognition and memory are normal.    Health Maintenance Due  Topic Date Due   COVID-19 Vaccine (1) Never done   HIV  Screening  Never done   Hepatitis C Screening  Never done    There are no preventive care reminders to display for this patient.   Lab Results  Component Value Date   TSH 0.487 04/20/2020   Lab Results  Component Value Date   WBC 11.8 (H) 12/06/2020   HGB 15.3 12/06/2020   HCT 42.9 12/06/2020   MCV 86.0 12/06/2020   PLT 286 12/06/2020   Lab Results  Component Value Date   NA 138 12/06/2020   K 4.0 12/06/2020   CO2 22 12/06/2020   GLUCOSE 110 (H) 12/06/2020   BUN 20 12/06/2020   CREATININE 1.02 12/06/2020   BILITOT 2.8 (H) 11/17/2020   ALKPHOS 46 11/17/2020   AST 21 11/17/2020   ALT 17 11/17/2020   PROT 8.7 (H) 11/17/2020   ALBUMIN 5.2 (H) 11/17/2020   CALCIUM 10.3 12/06/2020   ANIONGAP 12 12/06/2020   No results found for: CHOL No results found for: HDL No results found for: LDLCALC No results found for: TRIG No results found for: CHOLHDL No results found for: GEZM6Q     Assessment & Plan:  Trumaine was seen today for medication refill.  Diagnoses and all orders for this visit:  Gastroesophageal reflux disease with esophagitis without hemorrhage No red flags present. Diet discussed. Avoid fried, spicy, fatty, greasy, and acidic foods. Avoid caffeine, nicotine, and alcohol. Do not eat 2-3 hours before bedtime and stay upright for at least 1-2 hours after eating. Eat small frequent meals. Avoid NSAID's like motrin and aleve. Medications as prescribed. Report any new or worsening symptoms. Follow up as discussed or sooner if needed.     Other orders -     esomeprazole (NEXIUM) 40 MG capsule; Take 1 capsule (40 mg total) by mouth every morning.     Meds ordered this encounter  Medications   esomeprazole (NEXIUM) 40 MG capsule    Sig: Take 1 capsule (40 mg total) by mouth every morning.    Dispense:  90 capsule    Refill:  1     Grayce Sessions, NP

## 2021-06-04 ENCOUNTER — Emergency Department (HOSPITAL_BASED_OUTPATIENT_CLINIC_OR_DEPARTMENT_OTHER)
Admission: EM | Admit: 2021-06-04 | Discharge: 2021-06-04 | Disposition: A | Discharge status: Home / Self Care | Payer: Medicaid - Out of State | Attending: Emergency Medicine | Admitting: Emergency Medicine

## 2021-06-04 ENCOUNTER — Encounter (HOSPITAL_BASED_OUTPATIENT_CLINIC_OR_DEPARTMENT_OTHER): Payer: Self-pay

## 2021-06-04 ENCOUNTER — Other Ambulatory Visit: Payer: Self-pay

## 2021-06-04 DIAGNOSIS — D72829 Elevated white blood cell count, unspecified: Secondary | ICD-10-CM | POA: Diagnosis not present

## 2021-06-04 DIAGNOSIS — R1013 Epigastric pain: Secondary | ICD-10-CM | POA: Diagnosis not present

## 2021-06-04 DIAGNOSIS — R112 Nausea with vomiting, unspecified: Secondary | ICD-10-CM | POA: Insufficient documentation

## 2021-06-04 DIAGNOSIS — E876 Hypokalemia: Secondary | ICD-10-CM | POA: Insufficient documentation

## 2021-06-04 LAB — CBC WITH DIFFERENTIAL/PLATELET
Abs Immature Granulocytes: 0.05 10*3/uL (ref 0.00–0.07)
Basophils Absolute: 0 10*3/uL (ref 0.0–0.1)
Basophils Relative: 0 %
Eosinophils Absolute: 0 10*3/uL (ref 0.0–0.5)
Eosinophils Relative: 0 %
HCT: 43.8 % (ref 39.0–52.0)
Hemoglobin: 15.7 g/dL (ref 13.0–17.0)
Immature Granulocytes: 0 %
Lymphocytes Relative: 19 %
Lymphs Abs: 2.6 10*3/uL (ref 0.7–4.0)
MCH: 30.3 pg (ref 26.0–34.0)
MCHC: 35.8 g/dL (ref 30.0–36.0)
MCV: 84.4 fL (ref 80.0–100.0)
Monocytes Absolute: 1 10*3/uL (ref 0.1–1.0)
Monocytes Relative: 7 %
Neutro Abs: 10.2 10*3/uL — ABNORMAL HIGH (ref 1.7–7.7)
Neutrophils Relative %: 74 %
Platelets: 283 10*3/uL (ref 150–400)
RBC: 5.19 MIL/uL (ref 4.22–5.81)
RDW: 13.5 % (ref 11.5–15.5)
WBC: 14 10*3/uL — ABNORMAL HIGH (ref 4.0–10.5)
nRBC: 0 % (ref 0.0–0.2)

## 2021-06-04 LAB — COMPREHENSIVE METABOLIC PANEL
ALT: 17 U/L (ref 0–44)
AST: 27 U/L (ref 15–41)
Albumin: 5.1 g/dL — ABNORMAL HIGH (ref 3.5–5.0)
Alkaline Phosphatase: 44 U/L (ref 38–126)
Anion gap: 13 (ref 5–15)
BUN: 28 mg/dL — ABNORMAL HIGH (ref 6–20)
CO2: 21 mmol/L — ABNORMAL LOW (ref 22–32)
Calcium: 9.8 mg/dL (ref 8.9–10.3)
Chloride: 103 mmol/L (ref 98–111)
Creatinine, Ser: 1.02 mg/dL (ref 0.61–1.24)
GFR, Estimated: >60 mL/min (ref >60)
Glucose, Bld: 103 mg/dL — ABNORMAL HIGH (ref 70–99)
Potassium: 3.3 mmol/L — ABNORMAL LOW (ref 3.5–5.1)
Sodium: 137 mmol/L (ref 135–145)
Total Bilirubin: 2.3 mg/dL — ABNORMAL HIGH (ref 0.3–1.2)
Total Protein: 8.5 g/dL — ABNORMAL HIGH (ref 6.5–8.1)

## 2021-06-04 LAB — LIPASE, BLOOD: Lipase: 31 U/L (ref 11–51)

## 2021-06-04 MED ORDER — DIPHENHYDRAMINE HCL 50 MG/ML IJ SOLN
25.0000 mg | Freq: Once | INTRAMUSCULAR | Status: AC
  Administered 2021-06-04: 25 mg via INTRAVENOUS
  Filled 2021-06-04: qty 1

## 2021-06-04 MED ORDER — SODIUM CHLORIDE 0.9 % IV BOLUS
1000.0000 mL | Freq: Once | INTRAVENOUS | Status: AC
  Administered 2021-06-04: 1000 mL via INTRAVENOUS

## 2021-06-04 MED ORDER — PROCHLORPERAZINE EDISYLATE 10 MG/2ML IJ SOLN
10.0000 mg | Freq: Once | INTRAMUSCULAR | Status: AC
  Administered 2021-06-04: 10 mg via INTRAVENOUS
  Filled 2021-06-04: qty 2

## 2021-06-04 MED ORDER — PROMETHAZINE HCL 25 MG RE SUPP: 25.0000 mg | Freq: Four times a day (QID) | RECTAL | 0 refills | Status: AC | PRN

## 2021-06-04 MED ORDER — MORPHINE SULFATE (PF) 4 MG/ML IV SOLN
4.0000 mg | Freq: Once | INTRAVENOUS | Status: AC
  Administered 2021-06-04: 4 mg via INTRAVENOUS
  Filled 2021-06-04: qty 1

## 2021-06-04 MED ORDER — ONDANSETRON 4 MG PO TBDP: ORAL_TABLET | ORAL | 0 refills | Status: DC

## 2021-06-04 MED ORDER — ALUM & MAG HYDROXIDE-SIMETH 200-200-20 MG/5ML PO SUSP
30.0000 mL | Freq: Once | ORAL | Status: AC
  Administered 2021-06-04: 30 mL via ORAL
  Filled 2021-06-04: qty 30

## 2021-06-04 NOTE — ED Triage Notes (Signed)
Pt arrives pov, steady gait c/o hx of gerd, reports abdominal pain with n/v, fever x 3 days. Also c/o shob ?

## 2021-06-04 NOTE — ED Provider Notes (Signed)
?[facility] ?Provider Note ? ? ?CSN: [id number] ?Arrival date & time: 06/04/21  [id number] ? ?  ? ?History ? ?Chief Complaint  ?Patient presents with  ? Emesis  ? ? ?Justin Ferrell is a 31 y.o. male. ? ?31 yo M with a chief complaints of nausea vomiting and abdominal discomfort.  This been a chronic problem for him.  Off and on for the past 2 years.  Is been better controlled over the past years but worsening over the past 3 days.  Feels a burning pain to the upper portion of his abdomen with sour taste in his mouth.  Is on omeprazole.  Also has some drink that he thinks helps reduce the acid level in his stomach.  He denies fevers.  Denies suspicious food intake.  Denies recent travel.  Feels like it always does. ? ? ?Emesis ? ?  ? ?Home Medications ?Prior to Admission medications   ?Medication Sig Start Date End Date Taking? Authorizing Provider  ?ondansetron (ZOFRAN-ODT) 4 MG disintegrating tablet 4mg  ODT q4 hours prn nausea/vomit 06/04/21  Yes Deno Etienne, DO  ?promethazine (PHENERGAN) 25 MG suppository Place 1 suppository (25 mg total) rectally every 6 (six) hours as needed for nausea or vomiting. 06/04/21  Yes Deno Etienne, DO  ?esomeprazole (NEXIUM) 40 MG capsule Take 1 capsule (40 mg total) by mouth every morning. 02/21/21   Kerin Perna, NP  ?traZODone (DESYREL) 50 MG tablet TAKE 1 TABLET BY MOUTH EVERYDAY AT BEDTIME 10/23/20   Kerin Perna, NP  ?omeprazole (PRILOSEC) 20 MG capsule Take 20 mg by mouth daily as needed (acid reflux).  09/23/19  [provider]  ?sucralfate (CARAFATE) 1 GM/10ML suspension Take 10 mLs (1 g total) by mouth 4 (four) times daily -  with meals and at bedtime. 06/20/19 09/23/19  Volanda Napoleon, PA-C  ?   ? ?Allergies    ?Metoclopramide   ? ?Review of Systems   ?Review of Systems  ?Gastrointestinal:  Positive for vomiting.  ? ?Physical Exam ?Updated Vital Signs ?BP (!) 146/81 (BP Location: Right Arm)   Pulse 70   Temp 98.3 ?F (36.8  ?C) (Oral)   Resp 12   Ht 6\' 3"  (1.905 m)   Wt 81.6 kg   SpO2 100%   BMI 22.50 kg/m?  ?Physical Exam ?Vitals and nursing note reviewed.  ?Constitutional:   ?   Appearance: He is well-developed.  ?HENT:  ?   Head: Normocephalic and atraumatic.  ?Eyes:  ?   Pupils: Pupils are equal, round, and reactive to light.  ?Neck:  ?   Vascular: No JVD.  ?Cardiovascular:  ?   Rate and Rhythm: Normal rate and regular rhythm.  ?   Heart sounds: No murmur heard. ?  No friction rub. No gallop.  ?Pulmonary:  ?   Effort: No respiratory distress.  ?   Breath sounds: No wheezing.  ?Abdominal:  ?   General: There is no distension.  ?   Tenderness: There is no abdominal tenderness. There is no guarding or rebound.  ?   Comments: No obvious focal abdominal discomfort.  ?Musculoskeletal:     ?   General: Normal range of motion.  ?   Cervical back: Normal range of motion and neck supple.  ?Skin: ?   Coloration: Skin is not pale.  ?   Findings: No rash.  ?Neurological:  ?   Mental Status: He is alert and oriented to person, place, and time.  ?Psychiatric:     ?  Behavior: Behavior normal.  ? ? ?ED Results / Procedures / Treatments   ?Labs ?(all labs ordered are listed, but only abnormal results are displayed) ?Labs Reviewed  ?CBC WITH DIFFERENTIAL/PLATELET - Abnormal; Notable for the following components:  ?    Result Value  ? WBC 14.0 (*)   ? Neutro Abs 10.2 (*)   ? All other components within normal limits  ?COMPREHENSIVE METABOLIC PANEL - Abnormal; Notable for the following components:  ? Potassium 3.3 (*)   ? CO2 21 (*)   ? Glucose, Bld 103 (*)   ? BUN 28 (*)   ? Total Protein 8.5 (*)   ? Albumin 5.1 (*)   ? Total Bilirubin 2.3 (*)   ? All other components within normal limits  ?LIPASE, BLOOD  ? ? ?EKG ?EKG Interpretation ? ?Date/Time:  Monday June 04 2021 07:56:20 EDT ?Ventricular Rate:  66 ?PR Interval:  232 ?QRS Duration: 105 ?QT Interval:  403 ?QTC Calculation: 423 ?R Axis:   89 ?Text Interpretation: Sinus rhythm Prolonged PR  interval RSR' in V1 or V2, right VCD or RVH ST elev, probable normal early repol pattern No significant change since last tracing Confirmed by Melene Plan [phone]) on 06/04/2021 8:08:37 AM ? ?Radiology ?No results found. ? ?Procedures ?Procedures  ? ? ?Medications Ordered in ED ?Medications  ?sodium chloride 0.9 % bolus 1,000 mL (0 mLs Intravenous Stopped 06/04/21 0920)  ?prochlorperazine (COMPAZINE) injection 10 mg (10 mg Intravenous Given 06/04/21 0818)  ?diphenhydrAMINE (BENADRYL) injection 25 mg (25 mg Intravenous Given 06/04/21 0816)  ?morphine (PF) 4 MG/ML injection 4 mg (4 mg Intravenous Given 06/04/21 0814)  ?alum & mag hydroxide-simeth (MAALOX/MYLANTA) 200-200-20 MG/5ML suspension 30 mL (30 mLs Oral Given 06/04/21 0814)  ? ? ?ED Course/ Medical Decision Making/ A&P ?  ?                        ?Medical Decision Making ?Amount and/or Complexity of Data Reviewed ?Labs: ordered. ? ?Risk ?OTC drugs. ?Prescription drug management. ? ? ?31 yo M with a significant past medical history of reflux and recurrent ED visits with nausea and vomiting.  He comes in with a chief complaint of nausea vomiting and epigastric abdominal discomfort.  Feels similar to the way that it always does.  He denies any fevers denies any suspicious food intake or travel.  He is well-appearing and nontoxic.  We will give a bolus of IV fluids check blood work.  Treat pain and nausea.  GI cocktail.  Reassess. ? ? ?Patient is feeling better tolerating by mouth.  He has a mild leukocytosis.  Very trivial hypokalemia.  LFTs and lipase are unremarkable.  I discussed results with patient.  He will follow-up with his family doctor.  Continue omeprazole. ? ?12:01 PM:  I have discussed the diagnosis/risks/treatment options with the patient.  Evaluation and diagnostic testing in the emergency department does not suggest an emergent condition requiring admission or immediate intervention beyond what has been performed at this time.  They will follow up with   PCP. We also discussed returning to the ED immediately if new or worsening sx occur. We discussed the sx which are most concerning (e.g., sudden worsening pain, fever, inability to tolerate by mouth) that necessitate immediate return. Medications administered to the patient during their visit and any new prescriptions provided to the patient are listed below. ? ?Medications given during this visit ?Medications  ?sodium chloride 0.9 % bolus 1,000 mL (0 mLs Intravenous  Stopped 06/04/21 0920)  ?prochlorperazine (COMPAZINE) injection 10 mg (10 mg Intravenous Given 06/04/21 0818)  ?diphenhydrAMINE (BENADRYL) injection 25 mg (25 mg Intravenous Given 06/04/21 0816)  ?morphine (PF) 4 MG/ML injection 4 mg (4 mg Intravenous Given 06/04/21 0814)  ?alum & mag hydroxide-simeth (MAALOX/MYLANTA) 200-200-20 MG/5ML suspension 30 mL (30 mLs Oral Given 06/04/21 0814)  ? ? ? ?The patient appears reasonably screen and/or stabilized for discharge and I doubt any other medical condition or other [facility] requiring further screening, evaluation, or treatment in the ED at this time prior to discharge.  ? ? ? ? ? ? ?Final Clinical Impression(s) / ED Diagnoses ?Final diagnoses:  ?Nausea and vomiting in adult  ? ? ?Rx / DC Orders ?ED Discharge Orders   ? ?      Ordered  ?  ondansetron (ZOFRAN-ODT) 4 MG disintegrating tablet       ? 06/04/21 0916  ?  promethazine (PHENERGAN) 25 MG suppository  Every 6 hours PRN       ? 06/04/21 0916  ? ?  ?  ? ?  ? ? ?  ?Deno Etienne, DO ?06/04/21 1201 ? ?

## 2021-06-04 NOTE — ED Notes (Signed)
ED Provider at bedside. 

## 2021-06-04 NOTE — Discharge Instructions (Signed)
Continue to take your omeprazole as prescribed.  You do need to take this every day for it to work properly.  You were supposed to take it first thing in the morning when you wake up and then 30 minutes to an hour later you have to eat or drink something.  Try to avoid things that may make this worse, most commonly these are spicy foods tomato based products fatty foods chocolate and peppermint.  Alcohol and tobacco can also make this worse.  Return to the emergency department for sudden worsening pain fever or inability to eat or drink. ? ?

## 2021-06-04 NOTE — ED Notes (Signed)
Pt made aware that he will need a ride home if he has pain medication. Pt agrees to find ride home.  ?

## 2021-06-04 NOTE — ED Notes (Signed)
Pt states that he will get a ride home and not drive d/t medications he received while in ED.  ?

## 2021-07-27 ENCOUNTER — Emergency Department (HOSPITAL_COMMUNITY)
Admission: EM | Admit: 2021-07-27 | Discharge: 2021-07-27 | Disposition: A | Discharge status: Home / Self Care | Payer: Medicaid - Out of State | Attending: Emergency Medicine | Admitting: Emergency Medicine

## 2021-07-27 ENCOUNTER — Emergency Department (HOSPITAL_COMMUNITY): Payer: Medicaid - Out of State

## 2021-07-27 ENCOUNTER — Other Ambulatory Visit: Payer: Self-pay

## 2021-07-27 ENCOUNTER — Encounter (HOSPITAL_COMMUNITY): Payer: Self-pay

## 2021-07-27 DIAGNOSIS — R112 Nausea with vomiting, unspecified: Secondary | ICD-10-CM | POA: Insufficient documentation

## 2021-07-27 DIAGNOSIS — R1013 Epigastric pain: Secondary | ICD-10-CM | POA: Insufficient documentation

## 2021-07-27 DIAGNOSIS — R1010 Upper abdominal pain, unspecified: Secondary | ICD-10-CM | POA: Diagnosis present

## 2021-07-27 LAB — COMPREHENSIVE METABOLIC PANEL
ALT: 29 U/L (ref 0–44)
AST: 30 U/L (ref 15–41)
Albumin: 5.3 g/dL — ABNORMAL HIGH (ref 3.5–5.0)
Alkaline Phosphatase: 46 U/L (ref 38–126)
Anion gap: 11 (ref 5–15)
BUN: 24 mg/dL — ABNORMAL HIGH (ref 6–20)
CO2: 23 mmol/L (ref 22–32)
Calcium: 10.4 mg/dL — ABNORMAL HIGH (ref 8.9–10.3)
Chloride: 105 mmol/L (ref 98–111)
Creatinine, Ser: 1.08 mg/dL (ref 0.61–1.24)
GFR, Estimated: >60 mL/min (ref >60)
Glucose, Bld: 116 mg/dL — ABNORMAL HIGH (ref 70–99)
Potassium: 3.6 mmol/L (ref 3.5–5.1)
Sodium: 139 mmol/L (ref 135–145)
Total Bilirubin: 2 mg/dL — ABNORMAL HIGH (ref 0.3–1.2)
Total Protein: 9 g/dL — ABNORMAL HIGH (ref 6.5–8.1)

## 2021-07-27 LAB — CBC
HCT: 47.8 % (ref 39.0–52.0)
Hemoglobin: 16.9 g/dL (ref 13.0–17.0)
MCH: 30.3 pg (ref 26.0–34.0)
MCHC: 35.4 g/dL (ref 30.0–36.0)
MCV: 85.8 fL (ref 80.0–100.0)
Platelets: 290 10*3/uL (ref 150–400)
RBC: 5.57 MIL/uL (ref 4.22–5.81)
RDW: 13.6 % (ref 11.5–15.5)
WBC: 14.4 10*3/uL — ABNORMAL HIGH (ref 4.0–10.5)
nRBC: 0 % (ref 0.0–0.2)

## 2021-07-27 LAB — URINALYSIS, ROUTINE W REFLEX MICROSCOPIC
Bilirubin Urine: NEGATIVE
Glucose, UA: NEGATIVE mg/dL
Hgb urine dipstick: NEGATIVE
Ketones, ur: 20 mg/dL — AB
Leukocytes,Ua: NEGATIVE
Nitrite: NEGATIVE
Protein, ur: 100 mg/dL — AB
Specific Gravity, Urine: 1.033 — ABNORMAL HIGH (ref 1.005–1.030)
pH: 7 (ref 5.0–8.0)

## 2021-07-27 LAB — LIPASE, BLOOD: Lipase: 29 U/L (ref 11–51)

## 2021-07-27 IMAGING — US US ABDOMEN LIMITED
1 series · 15 of 25 positions shown · non-contrast
Comparison: None Available.

CLINICAL DATA: Right upper quadrant pain.

EXAM:
ULTRASOUND ABDOMEN LIMITED RIGHT UPPER QUADRANT

[Series 1: us abdomen limited ruq mc & wl · 38 acquisitions, 15 frames shown]
[im 1/38]
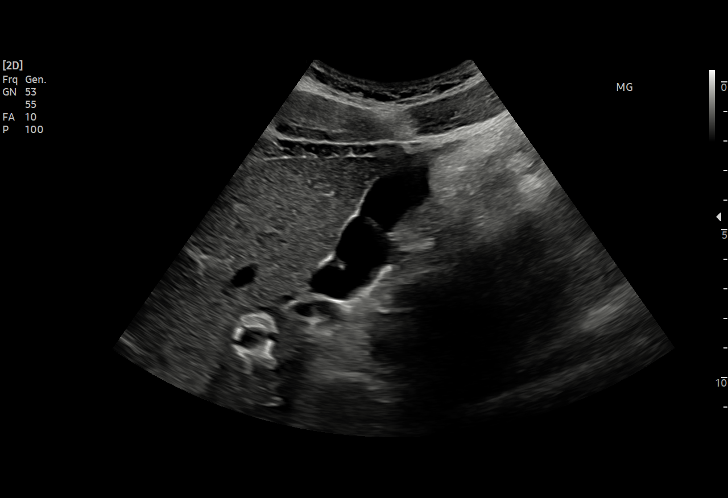
[im 4/38]
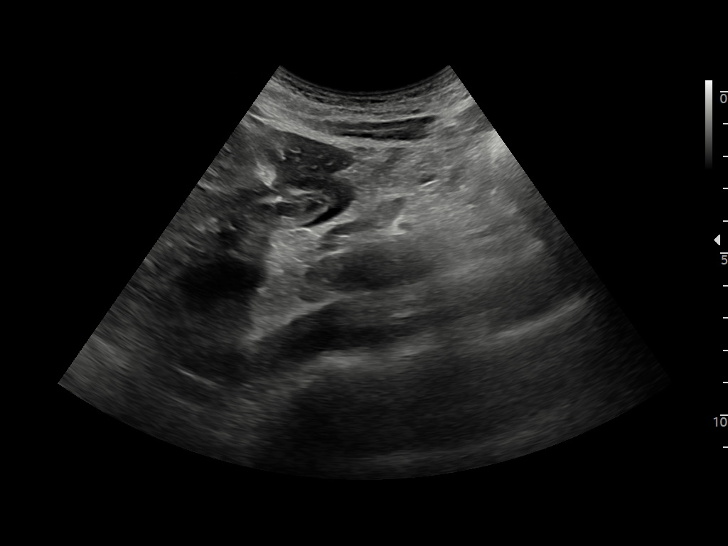
[im 7/38]
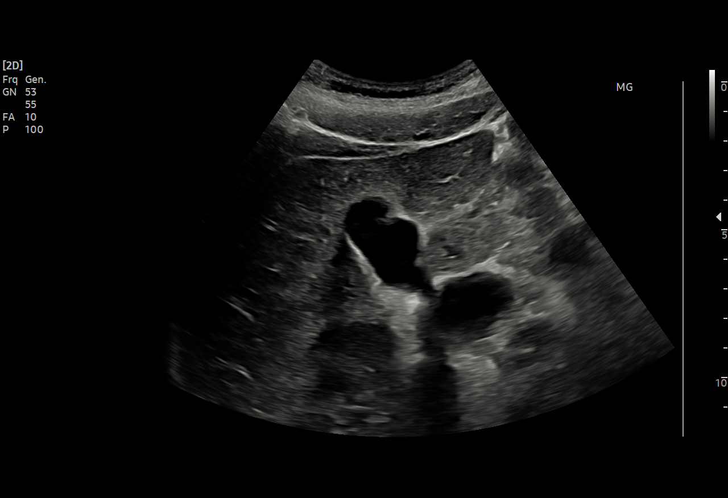
[im 8/38]
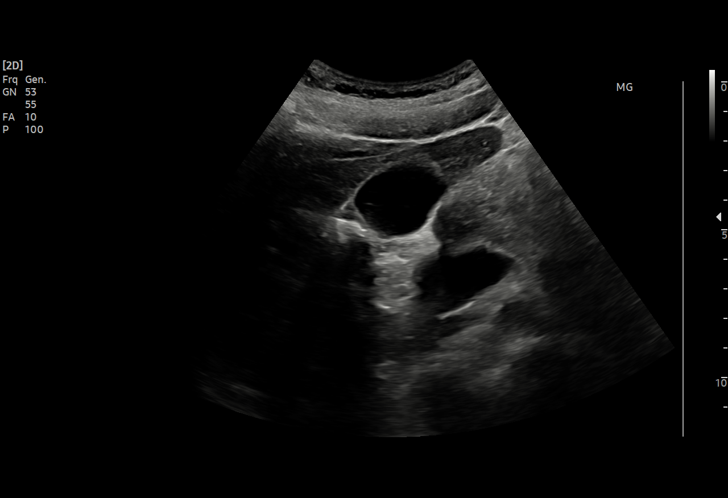
[im 11/38]
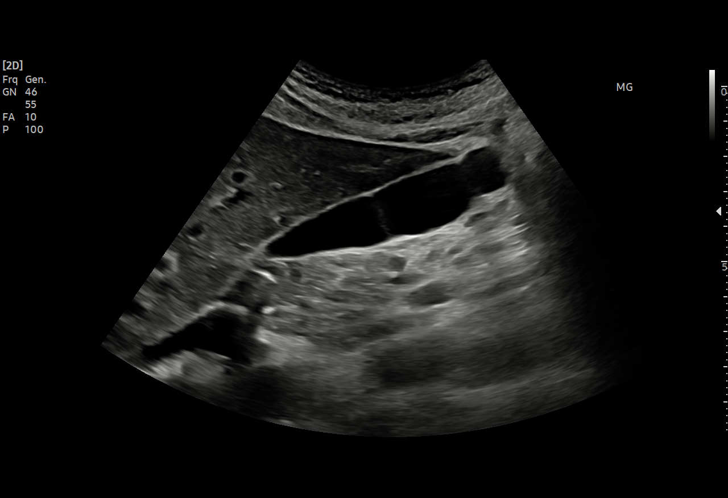
[im 14/38]
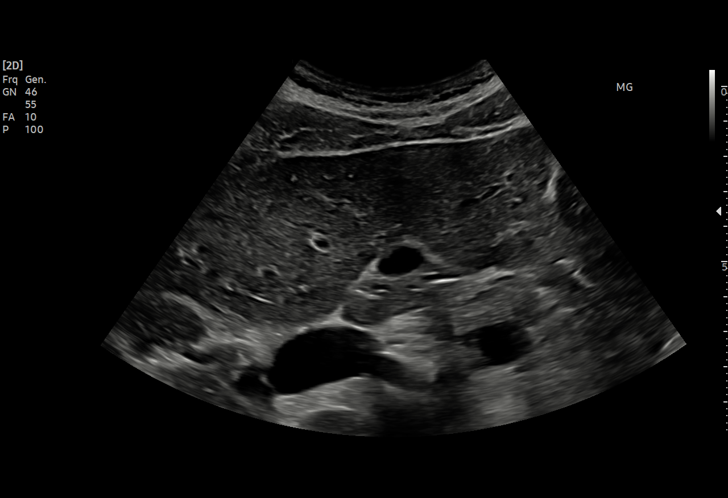
[im 16/38]
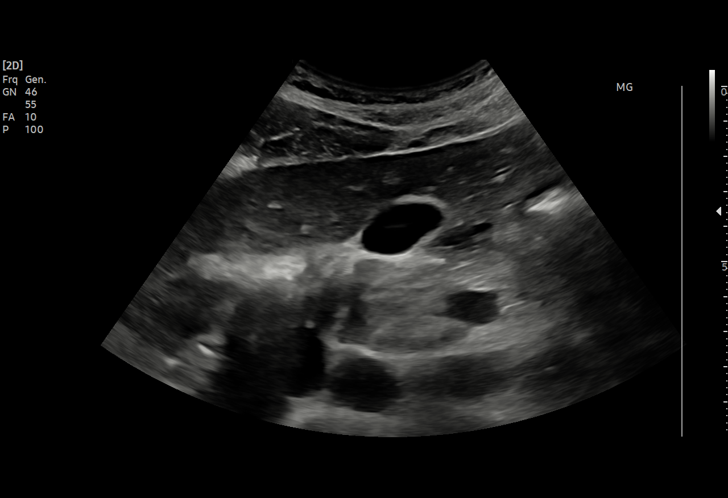
[im 19/38]
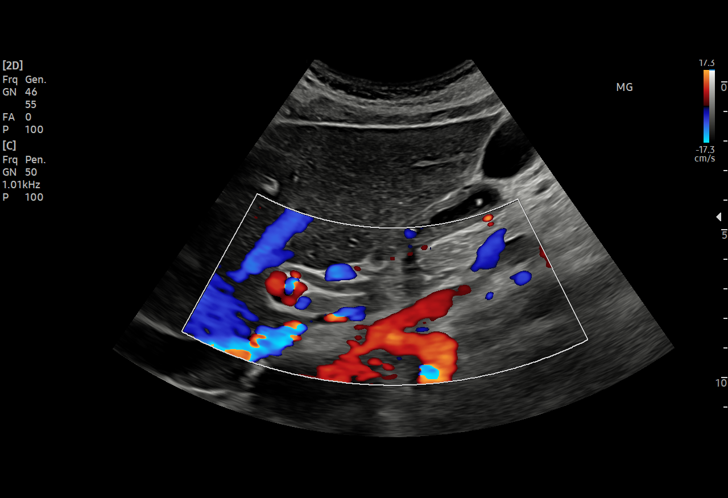
[im 22/38]
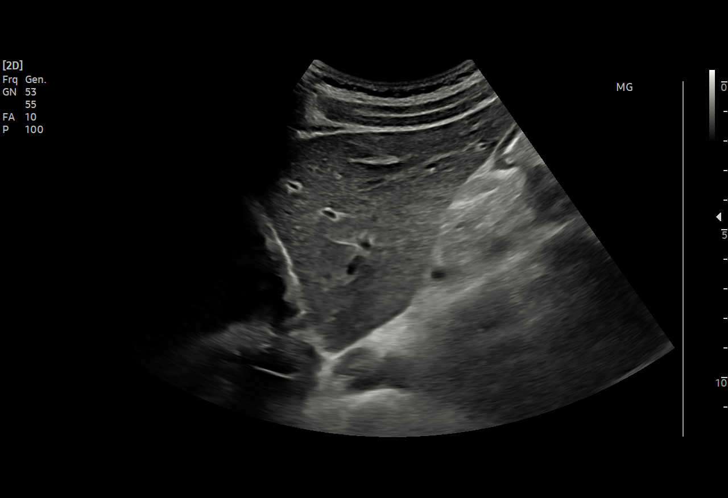
[im 24/38]
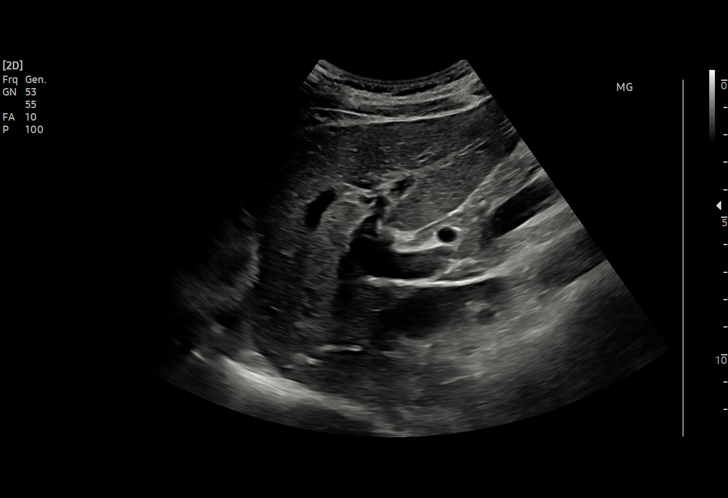
[im 27/38]
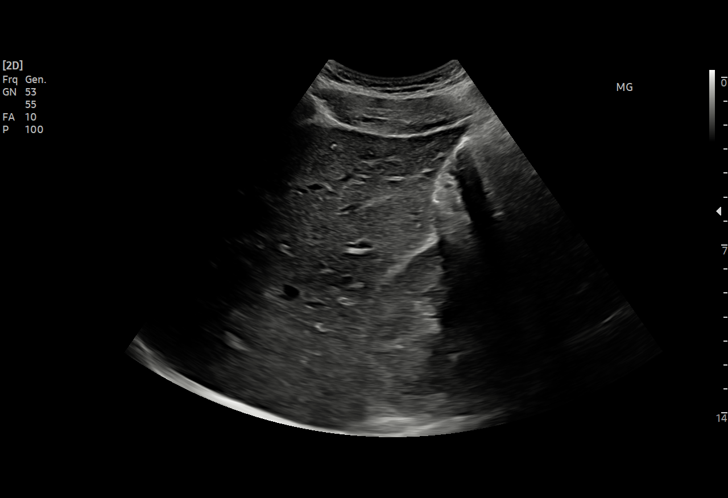
[im 30/38]
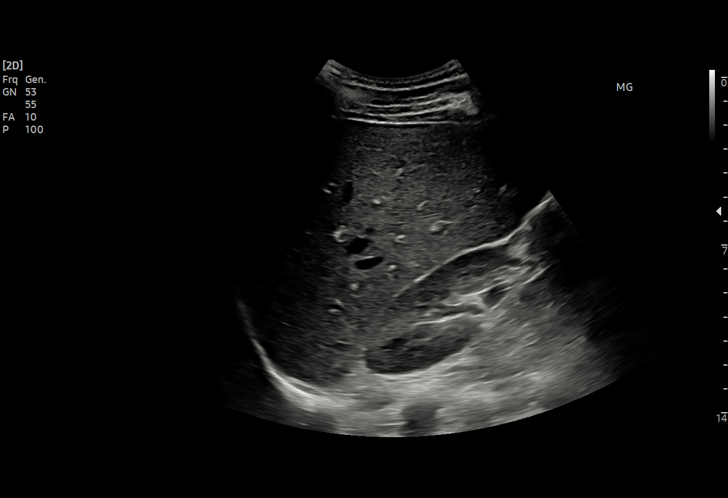
[im 31/38]
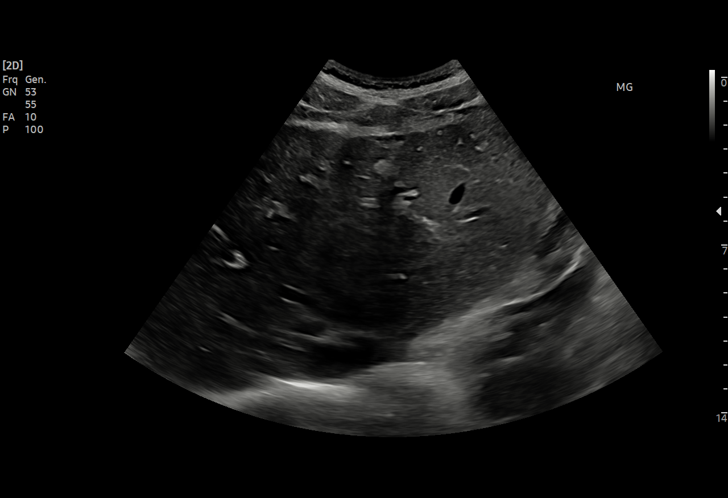
[im 34/38]
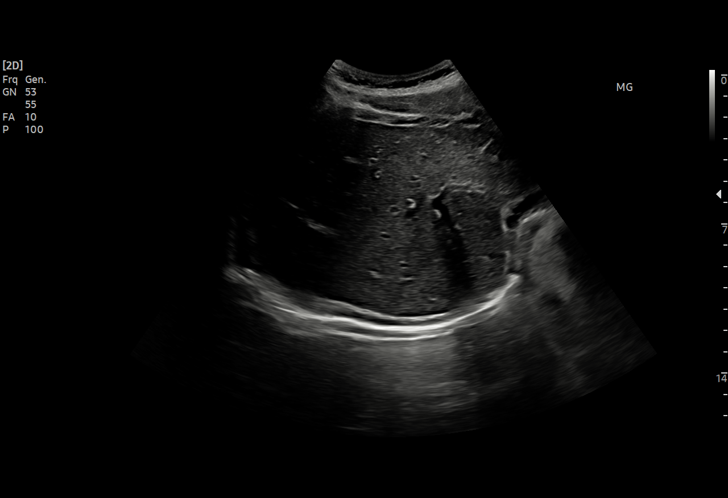
[im 38/38]
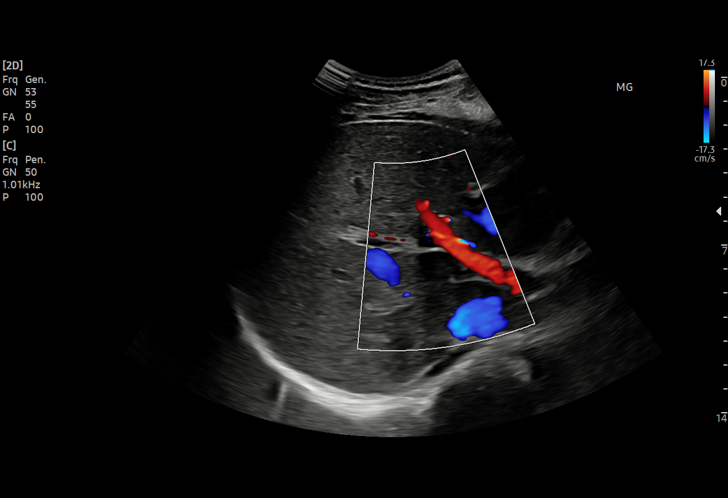

[15 of 25 positions shown; findings below may reference images not displayed]

FINDINGS: Gallbladder:

No gallstones or wall thickening visualized. No sonographic Murphy
sign noted by sonographer.

Common bile duct:

Diameter: 3 mm

Liver:

No focal lesion identified. Within normal limits in parenchymal
echogenicity. Portal vein is patent on color Doppler imaging with
normal direction of blood flow towards the liver.

Other: None.
IMPRESSION: Normal right upper quadrant ultrasound.

## 2021-07-27 MED ORDER — MAALOX MAX 400-400-40 MG/5ML PO SUSP: 5.0000 mL | Freq: Four times a day (QID) | ORAL | 0 refills | Status: AC | PRN

## 2021-07-27 MED ORDER — FAMOTIDINE IN NACL 20-0.9 MG/50ML-% IV SOLN
20.0000 mg | Freq: Once | INTRAVENOUS | Status: AC
  Administered 2021-07-27: 20 mg via INTRAVENOUS
  Filled 2021-07-27: qty 50

## 2021-07-27 MED ORDER — KETOROLAC TROMETHAMINE 30 MG/ML IJ SOLN
30.0000 mg | Freq: Once | INTRAMUSCULAR | Status: AC
  Administered 2021-07-27: 30 mg via INTRAVENOUS
  Filled 2021-07-27: qty 1

## 2021-07-27 MED ORDER — LIDOCAINE VISCOUS HCL 2 % MT SOLN
15.0000 mL | Freq: Once | OROMUCOSAL | Status: AC
  Administered 2021-07-27: 15 mL via ORAL
  Filled 2021-07-27: qty 15

## 2021-07-27 MED ORDER — MORPHINE SULFATE (PF) 4 MG/ML IV SOLN
4.0000 mg | Freq: Once | INTRAVENOUS | Status: AC
  Administered 2021-07-27: 4 mg via INTRAVENOUS
  Filled 2021-07-27: qty 1

## 2021-07-27 MED ORDER — ONDANSETRON HCL 4 MG/2ML IJ SOLN
4.0000 mg | Freq: Once | INTRAMUSCULAR | Status: AC
  Administered 2021-07-27: 4 mg via INTRAVENOUS
  Filled 2021-07-27: qty 2

## 2021-07-27 MED ORDER — SODIUM CHLORIDE 0.9 % IV BOLUS
1000.0000 mL | Freq: Once | INTRAVENOUS | Status: AC
  Administered 2021-07-27: 1000 mL via INTRAVENOUS

## 2021-07-27 MED ORDER — ALUM & MAG HYDROXIDE-SIMETH 200-200-20 MG/5ML PO SUSP
30.0000 mL | Freq: Once | ORAL | Status: AC
  Administered 2021-07-27: 30 mL via ORAL
  Filled 2021-07-27: qty 30

## 2021-07-27 MED ORDER — ONDANSETRON HCL 4 MG PO TABS: 4.0000 mg | ORAL_TABLET | Freq: Three times a day (TID) | ORAL | 0 refills | Status: AC | PRN

## 2021-07-27 MED ORDER — OMEPRAZOLE 20 MG PO CPDR: 20.0000 mg | DELAYED_RELEASE_CAPSULE | Freq: Every day | ORAL | 0 refills | Status: AC

## 2021-07-27 NOTE — Discharge Instructions (Addendum)
You have been seen and discharged from the emergency department.  Take medication as directed/needed.  Establish care with gastroenterology for reevaluation and possible endoscopy for further treatment of your symptoms.  If you have any worsening symptoms or further concerns for your health please return to an emergency department for further evaluation.

## 2021-07-27 NOTE — ED Notes (Signed)
Pt d/c home per MD order. Discharge summary reviewed, pt verbalizes understanding. Ambulatory off unit. No s/s of acute distress noted.  

## 2021-07-27 NOTE — ED Notes (Signed)
Pt provided with ice water

## 2021-07-27 NOTE — ED Triage Notes (Signed)
Patient reports that he has been having mid upper abdominal pain and emesis since yesterday. Patient states a history of GERD.

## 2021-07-27 NOTE — ED Provider Notes (Signed)
[facility]-EMERGENCY DEPT Provider Note   CSN: [id number] Arrival date & time: 07/27/21  0818     History  Chief Complaint  Patient presents with   Abdominal Pain   Emesis    Justin Ferrell is a 31 y.o. male.  HPI  31 year old male with past medical history of GERD presents to the emergency department with concern for mid upper abdominal pain associated with nausea/vomiting.  Patient states that his symptoms started yesterday.  Initially started with "sour stomach".  He states anytime he tries to eat or drink he has immediate emesis.  Denies any blood in the emesis.  States he was diagnosed with GERD remotely after having an endoscopy.  No history of gastric ulcers.  Denies any history of pathology with his gallbladder pancreas.  States he has been having fever/chills.  Denies any diarrhea.  No genitourinary symptoms, chest pain or shortness of breath.  Home Medications Prior to Admission medications   Medication Sig Start Date End Date Taking? Authorizing Provider  esomeprazole (NEXIUM) 40 MG capsule Take 1 capsule (40 mg total) by mouth every morning. 02/21/21   Grayce Sessions, NP  ondansetron (ZOFRAN-ODT) 4 MG disintegrating tablet 4mg  ODT q4 hours prn nausea/vomit 06/04/21   08/04/21, DO  promethazine (PHENERGAN) 25 MG suppository Place 1 suppository (25 mg total) rectally every 6 (six) hours as needed for nausea or vomiting. 06/04/21   08/04/21, DO  traZODone (DESYREL) 50 MG tablet TAKE 1 TABLET BY MOUTH EVERYDAY AT BEDTIME 10/23/20   10/25/20, NP  omeprazole (PRILOSEC) 20 MG capsule Take 20 mg by mouth daily as needed (acid reflux).  09/23/19  [provider]  sucralfate (CARAFATE) 1 GM/10ML suspension Take 10 mLs (1 g total) by mouth 4 (four) times daily -  with meals and at bedtime. 06/20/19 09/23/19  09/25/19, PA-C      Allergies    Metoclopramide    Review of Systems   Review of Systems  Constitutional:   Positive for appetite change, chills and fever.  Respiratory:  Negative for chest tightness and shortness of breath.   Cardiovascular:  Negative for chest pain.  Gastrointestinal:  Positive for abdominal pain, nausea and vomiting. Negative for diarrhea.  Genitourinary:  Negative for flank pain, frequency and hematuria.  Skin:  Negative for rash.  Neurological:  Negative for headaches.   Physical Exam Updated Vital Signs BP 132/89   Pulse 62   Temp 98.2 F (36.8 C) (Oral)   Resp 12   Ht 6\' 3"  (1.905 m)   Wt 79.4 kg   SpO2 100%   BMI 21.87 kg/m  Physical Exam Vitals and nursing note reviewed.  Constitutional:      General: He is not in acute distress.    Appearance: Normal appearance.  HENT:     Head: Normocephalic.     Mouth/Throat:     Mouth: Mucous membranes are moist.  Cardiovascular:     Rate and Rhythm: Normal rate.  Pulmonary:     Effort: Pulmonary effort is normal. No respiratory distress.  Abdominal:     Palpations: Abdomen is soft.     Tenderness: There is abdominal tenderness in the epigastric area. There is no guarding or rebound. Negative signs include Murphy's sign.  Skin:    General: Skin is warm.  Neurological:     Mental Status: He is alert and oriented to person, place, and time. Mental status is at baseline.  Psychiatric:  Mood and Affect: Mood normal.    ED Results / Procedures / Treatments   Labs (all labs ordered are listed, but only abnormal results are displayed) Labs Reviewed  COMPREHENSIVE METABOLIC PANEL - Abnormal; Notable for the following components:      Result Value   Glucose, Bld 116 (*)    BUN 24 (*)    Calcium 10.4 (*)    Total Protein 9.0 (*)    Albumin 5.3 (*)    Total Bilirubin 2.0 (*)    All other components within normal limits  CBC - Abnormal; Notable for the following components:   WBC 14.4 (*)    All other components within normal limits  LIPASE, BLOOD  URINALYSIS, ROUTINE W REFLEX MICROSCOPIC     EKG None  Radiology No results found.  Procedures Procedures    Medications Ordered in ED Medications  famotidine (PEPCID) IVPB 20 mg premix (20 mg Intravenous New Bag/Given 07/27/21 0928)  morphine (PF) 4 MG/ML injection 4 mg (4 mg Intravenous Given 07/27/21 0927)  sodium chloride 0.9 % bolus 1,000 mL (1,000 mLs Intravenous New Bag/Given 07/27/21 0925)  ondansetron (ZOFRAN) injection 4 mg (4 mg Intravenous Given 07/27/21 9485)    ED Course/ Medical Decision Making/ A&P                           Medical Decision Making Amount and/or Complexity of Data Reviewed Labs: ordered. Radiology: ordered.  Risk OTC drugs. Prescription drug management.   30 year old male presents emergency department epigastric discomfort, nausea/vomiting.  History of GERD in the past requiring endoscopy.  Used to take Prilosec daily, no longer has a prescription.  Has been using over-the-counter Pepcid without significant relief.  Vitals are normal on arrival.  Abdominal exam is benign.  No active emesis.  Blood work is reassuring, bilirubin is elevated but baseline.  Ultrasound of the upper quadrant shows no acute findings with the gallbladder.  After therapy here in the emergency department has been able to tolerate p.o., symptoms resolved.  Recommended outpatient GI follow-up, most likely reendoscopy.  Medication sent for symptomatic treatment.  Patient at this time appears safe and stable for discharge and close outpatient follow up. Discharge plan and strict return to ED precautions discussed, patient verbalizes understanding and agreement.        Final Clinical Impression(s) / ED Diagnoses Final diagnoses:  None    Rx / DC Orders ED Discharge Orders     None         Rozelle Logan, DO 07/27/21 1407

## 2021-07-29 ENCOUNTER — Other Ambulatory Visit: Payer: Self-pay

## 2021-07-29 ENCOUNTER — Emergency Department (HOSPITAL_COMMUNITY)
Admission: EM | Admit: 2021-07-29 | Discharge: 2021-07-30 | Disposition: A | Discharge status: Home / Self Care | Payer: Medicaid - Out of State | Attending: Emergency Medicine | Admitting: Emergency Medicine

## 2021-07-29 ENCOUNTER — Encounter (HOSPITAL_COMMUNITY): Payer: Self-pay

## 2021-07-29 DIAGNOSIS — K219 Gastro-esophageal reflux disease without esophagitis: Secondary | ICD-10-CM | POA: Insufficient documentation

## 2021-07-29 DIAGNOSIS — D72829 Elevated white blood cell count, unspecified: Secondary | ICD-10-CM | POA: Insufficient documentation

## 2021-07-29 DIAGNOSIS — L42 Pityriasis rosea: Secondary | ICD-10-CM | POA: Insufficient documentation

## 2021-07-29 DIAGNOSIS — R1013 Epigastric pain: Secondary | ICD-10-CM | POA: Diagnosis present

## 2021-07-29 LAB — COMPREHENSIVE METABOLIC PANEL
ALT: 20 U/L (ref 0–44)
AST: 21 U/L (ref 15–41)
Albumin: 5 g/dL (ref 3.5–5.0)
Alkaline Phosphatase: 41 U/L (ref 38–126)
Anion gap: 9 (ref 5–15)
BUN: 22 mg/dL — ABNORMAL HIGH (ref 6–20)
CO2: 22 mmol/L (ref 22–32)
Calcium: 9.9 mg/dL (ref 8.9–10.3)
Chloride: 107 mmol/L (ref 98–111)
Creatinine, Ser: 0.9 mg/dL (ref 0.61–1.24)
GFR, Estimated: >60 mL/min (ref >60)
Glucose, Bld: 118 mg/dL — ABNORMAL HIGH (ref 70–99)
Potassium: 3.7 mmol/L (ref 3.5–5.1)
Sodium: 138 mmol/L (ref 135–145)
Total Bilirubin: 2.7 mg/dL — ABNORMAL HIGH (ref 0.3–1.2)
Total Protein: 8.3 g/dL — ABNORMAL HIGH (ref 6.5–8.1)

## 2021-07-29 LAB — URINALYSIS, ROUTINE W REFLEX MICROSCOPIC
Bacteria, UA: NONE SEEN
Bilirubin Urine: NEGATIVE
Glucose, UA: NEGATIVE mg/dL
Hgb urine dipstick: NEGATIVE
Ketones, ur: 5 mg/dL — AB
Leukocytes,Ua: NEGATIVE
Nitrite: NEGATIVE
Protein, ur: 30 mg/dL — AB
Specific Gravity, Urine: 1.025 (ref 1.005–1.030)
pH: 6 (ref 5.0–8.0)

## 2021-07-29 LAB — CBC WITH DIFFERENTIAL/PLATELET
Abs Immature Granulocytes: 0.04 10*3/uL (ref 0.00–0.07)
Basophils Absolute: 0.1 10*3/uL (ref 0.0–0.1)
Basophils Relative: 0 %
Eosinophils Absolute: 0 10*3/uL (ref 0.0–0.5)
Eosinophils Relative: 0 %
HCT: 43.4 % (ref 39.0–52.0)
Hemoglobin: 15.2 g/dL (ref 13.0–17.0)
Immature Granulocytes: 0 %
Lymphocytes Relative: 25 %
Lymphs Abs: 3.3 10*3/uL (ref 0.7–4.0)
MCH: 30.2 pg (ref 26.0–34.0)
MCHC: 35 g/dL (ref 30.0–36.0)
MCV: 86.3 fL (ref 80.0–100.0)
Monocytes Absolute: 0.9 10*3/uL (ref 0.1–1.0)
Monocytes Relative: 7 %
Neutro Abs: 9.1 10*3/uL — ABNORMAL HIGH (ref 1.7–7.7)
Neutrophils Relative %: 68 %
Platelets: 294 10*3/uL (ref 150–400)
RBC: 5.03 MIL/uL (ref 4.22–5.81)
RDW: 13.5 % (ref 11.5–15.5)
WBC: 13.4 10*3/uL — ABNORMAL HIGH (ref 4.0–10.5)
nRBC: 0 % (ref 0.0–0.2)

## 2021-07-29 LAB — LIPASE, BLOOD: Lipase: 31 U/L (ref 11–51)

## 2021-07-29 NOTE — ED Triage Notes (Signed)
Pt presents to ED with c/o mid to upper abdominal pain. Pt seen on 6/2 for same. States pain has been constant and has worsened since being seen despite medication management. Hx GERD

## 2021-07-29 NOTE — ED Provider Notes (Incomplete)
[facility]-EMERGENCY DEPT Provider Note   CSN: [id number] Arrival date & time: 07/29/21  1953     History {Add pertinent medical, surgical, social history, OB history to HPI:1} Chief Complaint  Patient presents with  . Abdominal Pain    Justin Ferrell is a 31 y.o. male.  The history is provided by the patient.  Abdominal Pain She has history of GERD   Home Medications Prior to Admission medications   Medication Sig Start Date End Date Taking? Authorizing Provider  alum & mag hydroxide-simeth (MAALOX MAX) 400-400-40 MG/5ML suspension Take 5 mLs by mouth every 6 (six) hours as needed for indigestion. 07/27/21   Horton, Clabe Seal, DO  omeprazole (PRILOSEC) 20 MG capsule Take 1 capsule (20 mg total) by mouth daily. 07/27/21   Horton, Clabe Seal, DO  ondansetron (ZOFRAN) 4 MG tablet Take 1 tablet (4 mg total) by mouth every 8 (eight) hours as needed for nausea or vomiting. 07/27/21   Horton, Clabe Seal, DO  promethazine (PHENERGAN) 25 MG suppository Place 1 suppository (25 mg total) rectally every 6 (six) hours as needed for nausea or vomiting. 06/04/21   Melene Plan, DO  traZODone (DESYREL) 50 MG tablet TAKE 1 TABLET BY MOUTH EVERYDAY AT BEDTIME 10/23/20   Grayce Sessions, NP  sucralfate (CARAFATE) 1 GM/10ML suspension Take 10 mLs (1 g total) by mouth 4 (four) times daily -  with meals and at bedtime. 06/20/19 09/23/19  Maxwell Caul, PA-C      Allergies    Metoclopramide    Review of Systems   Review of Systems  Gastrointestinal:  Positive for abdominal pain.  All other systems reviewed and are negative.  Physical Exam Updated Vital Signs BP 140/73 (BP Location: Left Arm)   Pulse 70   Temp 98.4 F (36.9 C) (Oral)   Resp 17   Ht 6\' 3"  (1.905 m)   Wt 79.4 kg   SpO2 100%   BMI 21.87 kg/m  Physical Exam Vitals and nursing note reviewed.  32 year old male, resting comfortably and in no acute distress. Vital signs are ***. Oxygen saturation is  ***%, which is normal. Head is normocephalic and atraumatic. PERRLA, EOMI. Oropharynx is clear. Neck is nontender and supple without adenopathy or JVD. Back is nontender and there is no CVA tenderness. Lungs are clear without rales, wheezes, or rhonchi. Chest is nontender. Heart has regular rate and rhythm without murmur. Abdomen is soft, flat, nontender without masses or hepatosplenomegaly and peristalsis is normoactive. Extremities have no cyanosis or edema, full range of motion is present. Skin is warm and dry without rash. Neurologic: Mental status is normal, cranial nerves are intact, there are no motor or sensory deficits.  ED Results / Procedures / Treatments   Labs (all labs ordered are listed, but only abnormal results are displayed) Labs Reviewed  COMPREHENSIVE METABOLIC PANEL - Abnormal; Notable for the following components:      Result Value   Glucose, Bld 118 (*)    BUN 22 (*)    Total Protein 8.3 (*)    Total Bilirubin 2.7 (*)    All other components within normal limits  CBC WITH DIFFERENTIAL/PLATELET - Abnormal; Notable for the following components:   WBC 13.4 (*)    Neutro Abs 9.1 (*)    All other components within normal limits  URINALYSIS, ROUTINE W REFLEX MICROSCOPIC - Abnormal; Notable for the following components:   Ketones, ur 5 (*)    Protein, ur 30 (*)  All other components within normal limits  LIPASE, BLOOD    EKG None  Radiology No results found.  Procedures Procedures  {Document cardiac monitor, telemetry assessment procedure when appropriate:1}  Medications Ordered in ED Medications - No data to display  ED Course/ Medical Decision Making/ A&P                           Medical Decision Making Amount and/or Complexity of Data Reviewed Labs: ordered.   ***  {Document critical care time when appropriate:1} {Document review of labs and clinical decision tools ie heart score, Chads2Vasc2 etc:1}  {Document your independent review of  radiology images, and any outside records:1} {Document your discussion with family members, caretakers, and with consultants:1} {Document social determinants of health affecting pt's care:1} {Document your decision making why or why not admission, treatments were needed:1} Final Clinical Impression(s) / ED Diagnoses Final diagnoses:  None    Rx / DC Orders ED Discharge Orders     None

## 2021-07-29 NOTE — ED Provider Notes (Signed)
[facility]-EMERGENCY DEPT Provider Note   CSN: [id number] Arrival date & time: 07/29/21  1953     History  Chief Complaint  Patient presents with   Abdominal Pain    Justin Ferrell is a 31 y.o. male.  The history is provided by the patient.  Abdominal Pain She has history of GERD and comes in with ongoing epigastric burning with some radiation to the back.  He has noted a lot of burping.  There has been nausea and vomiting.  He was seen in the emergency department 2 days ago and discharged with prescription for omeprazole.  He has been taking the omeprazole without any relief.  He did state that he got temporary relief with GI cocktail when he was in the emergency department.  He has not made an appointment with gastroenterology because he has no medical insurance.   Home Medications Prior to Admission medications   Medication Sig Start Date End Date Taking? Authorizing Provider  alum & mag hydroxide-simeth (MAALOX MAX) 400-400-40 MG/5ML suspension Take 5 mLs by mouth every 6 (six) hours as needed for indigestion. 07/27/21   Horton, Clabe Seal, DO  omeprazole (PRILOSEC) 20 MG capsule Take 1 capsule (20 mg total) by mouth daily. 07/27/21   Horton, Clabe Seal, DO  ondansetron (ZOFRAN) 4 MG tablet Take 1 tablet (4 mg total) by mouth every 8 (eight) hours as needed for nausea or vomiting. 07/27/21   Horton, Clabe Seal, DO  promethazine (PHENERGAN) 25 MG suppository Place 1 suppository (25 mg total) rectally every 6 (six) hours as needed for nausea or vomiting. 06/04/21   Melene Plan, DO  traZODone (DESYREL) 50 MG tablet TAKE 1 TABLET BY MOUTH EVERYDAY AT BEDTIME 10/23/20   Grayce Sessions, NP  sucralfate (CARAFATE) 1 GM/10ML suspension Take 10 mLs (1 g total) by mouth 4 (four) times daily -  with meals and at bedtime. 06/20/19 09/23/19  Maxwell Caul, PA-C      Allergies    Metoclopramide    Review of Systems   Review of Systems  Gastrointestinal:  Positive for  abdominal pain.  All other systems reviewed and are negative.  Physical Exam Updated Vital Signs BP 140/73 (BP Location: Left Arm)   Pulse 70   Temp 98.4 F (36.9 C) (Oral)   Resp 17   Ht 6\' 3"  (1.905 m)   Wt 79.4 kg   SpO2 100%   BMI 21.87 kg/m  Physical Exam Vitals and nursing note reviewed.  31 year old male, appears mildly uncomfortable, but is in no acute distress. Vital signs are significant for elevated systolic blood pressure. Oxygen saturation is 100%, which is normal. Head is normocephalic and atraumatic. PERRLA, EOMI. Oropharynx is clear. Neck is nontender and supple without adenopathy or JVD. Back is nontender and there is no CVA tenderness. Lungs are clear without rales, wheezes, or rhonchi. Chest is nontender. Heart has regular rate and rhythm without murmur. Abdomen is soft, flat, nontender.  Peristalsis is hypoactive. Extremities have no cyanosis or edema, full range of motion is present. Skin is warm and dry without rash. Neurologic: Mental status is normal, cranial nerves are intact, moves all extremities equally.  ED Results / Procedures / Treatments   Labs (all labs ordered are listed, but only abnormal results are displayed) Labs Reviewed  COMPREHENSIVE METABOLIC PANEL - Abnormal; Notable for the following components:      Result Value   Glucose, Bld 118 (*)    BUN 22 (*)  Total Protein 8.3 (*)    Total Bilirubin 2.7 (*)    All other components within normal limits  CBC WITH DIFFERENTIAL/PLATELET - Abnormal; Notable for the following components:   WBC 13.4 (*)    Neutro Abs 9.1 (*)    All other components within normal limits  URINALYSIS, ROUTINE W REFLEX MICROSCOPIC - Abnormal; Notable for the following components:   Ketones, ur 5 (*)    Protein, ur 30 (*)    All other components within normal limits  LIPASE, BLOOD   Radiology CT ABDOMEN PELVIS W CONTRAST  Result Date: 07/30/2021 CLINICAL DATA:  Left lower quadrant pain EXAM: CT ABDOMEN AND  PELVIS WITH CONTRAST TECHNIQUE: Multidetector CT imaging of the abdomen and pelvis was performed using the standard protocol following bolus administration of intravenous contrast. RADIATION DOSE REDUCTION: This exam was performed according to the departmental dose-optimization program which includes automated exposure control, adjustment of the mA and/or kV according to patient size and/or use of iterative reconstruction technique. CONTRAST:  OMNIPAQUE IOHEXOL 300 MG/ML  SOLN COMPARISON:  Ultrasound 07/27/2021 FINDINGS: Lower chest: No acute abnormality. Hepatobiliary: No focal liver abnormality is seen. No gallstones, gallbladder wall thickening, or biliary dilatation. Pancreas: Unremarkable. No pancreatic ductal dilatation or surrounding inflammatory changes. Spleen: Normal in size without focal abnormality. Adrenals/Urinary Tract: Adrenal glands are unremarkable. Kidneys are normal, without renal calculi, focal lesion, or hydronephrosis. Bladder is unremarkable. Stomach/Bowel: Stomach is within normal limits. Appendix appears normal. No evidence of bowel wall thickening, distention, or inflammatory changes. Vascular/Lymphatic: No significant vascular findings are present. No enlarged abdominal or pelvic lymph nodes. Reproductive: Prostate is unremarkable. Other: No abdominal wall hernia or abnormality. No abdominopelvic ascites. Musculoskeletal: No acute or significant osseous findings. IMPRESSION: Negative. No CT evidence for acute intra-abdominal or pelvic abnormality Electronically Signed   By: Jasmine Pang M.D.   On: 07/30/2021 01:07    Procedures Procedures    Medications Ordered in ED Medications  lactated ringers bolus 1,000 mL (0 mLs Intravenous Stopped 07/30/21 0147)  pantoprazole (PROTONIX) injection 40 mg (40 mg Intravenous Given 07/30/21 0035)  morphine (PF) 4 MG/ML injection 4 mg (4 mg Intravenous Given 07/30/21 0034)  ondansetron (ZOFRAN) injection 4 mg (4 mg Intravenous Given 07/30/21  0033)  iohexol (OMNIPAQUE) 300 MG/ML solution 100 mL (100 mLs Intravenous Contrast Given 07/30/21 0041)    ED Course/ Medical Decision Making/ A&P                           Medical Decision Making Amount and/or Complexity of Data Reviewed Labs: ordered. Radiology: ordered.  Risk Prescription drug management.   Epigastric pain which certainly seems consistent with GERD, not responding to standard dose proton pump inhibitor.  I have reviewed and interpreted all of the laboratory tests.  There is a mild leukocytosis which appears to be chronic, differential is normal.  Total bilirubin is elevated.  I have reviewed prior lab tests and bilirubin has been consistently elevated as far back as 03/26/2019.  Transaminases have been consistently normal.  In care everywhere, bilirubin was fractionated on 12/01/2019, and he was found to have elevated indirect bilirubin with normal direct bilirubin.  This is most consistent with Gilbert's disease.  He is given IV fluids, morphine, pantoprazole, ondansetron.  CT shows no acute process.  I have independently viewed all of the images, and agree with radiologist's interpretation.  Patient does feel somewhat better following above-noted medication.  He is discharged with instructions to  increase his omeprazole to twice a day and to supplement it with famotidine.  Recommended he take over-the-counter antacids as needed.  Encouraged follow-up with gastroenterology.  Final Clinical Impression(s) / ED Diagnoses Final diagnoses:  Epigastric pain  Gastroesophageal reflux disease, unspecified whether esophagitis present  Gilbert's disease    Rx / DC Orders ED Discharge Orders     None         Dione BoozeGlick, Janesia Joswick, MD 07/30/21 [phone]

## 2021-07-30 ENCOUNTER — Emergency Department (HOSPITAL_COMMUNITY): Payer: Medicaid - Out of State

## 2021-07-30 IMAGING — CT CT ABD-PELV W/ CM
2 of 4 series · 16 of 46 positions shown, 18 images · IV contrast (agent unspecified)
Comparison: Ultrasound [DATE]

CLINICAL DATA: Left lower quadrant pain

EXAM:
CT ABDOMEN AND PELVIS WITH CONTRAST
TECHNIQUE: Multidetector CT imaging of the abdomen and pelvis was performed
using the standard protocol following bolus administration of
intravenous contrast.

[Series 2: axial st · axial · 0.80mm/px · z∈[+974,+1379]mm · 13 of 93 slices shown, 15 images]
[im 6/93  soft-tissue]
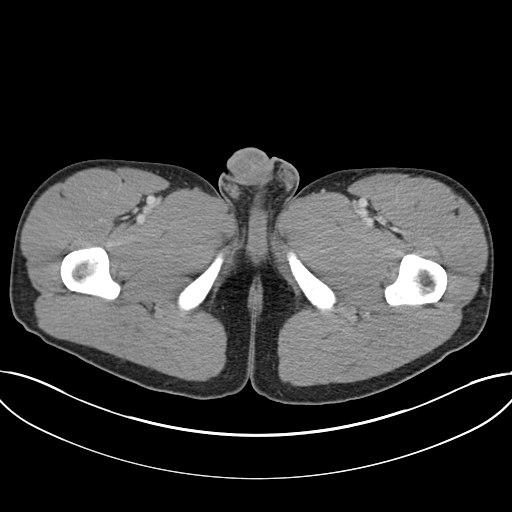
[im 6/93  bone]
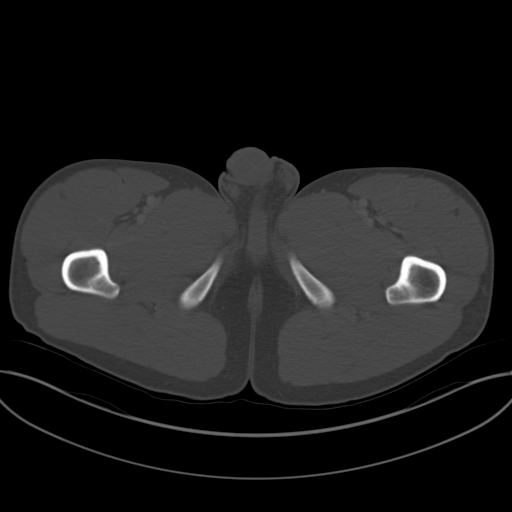
[im 12/93  soft-tissue]
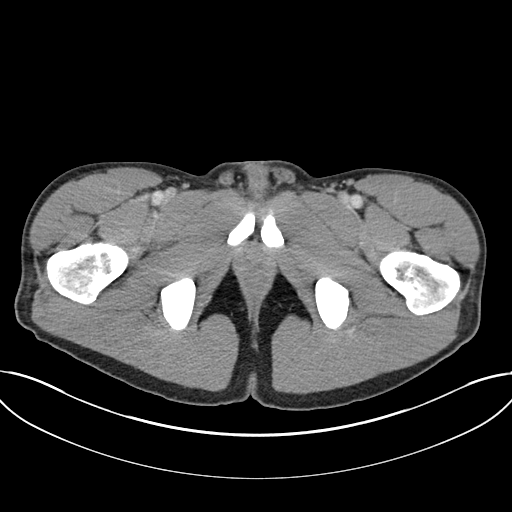
[im 18/93  soft-tissue]
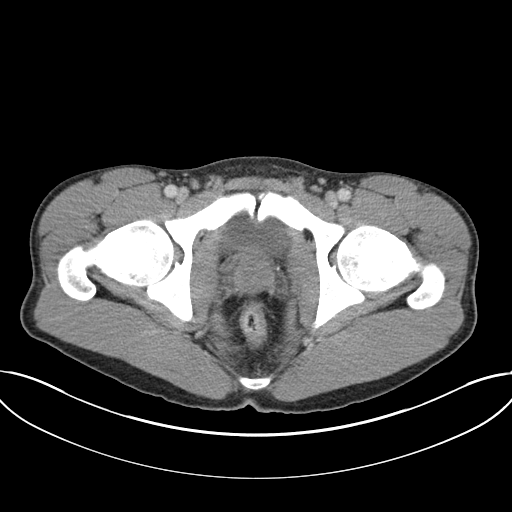
[im 29/93  soft-tissue]
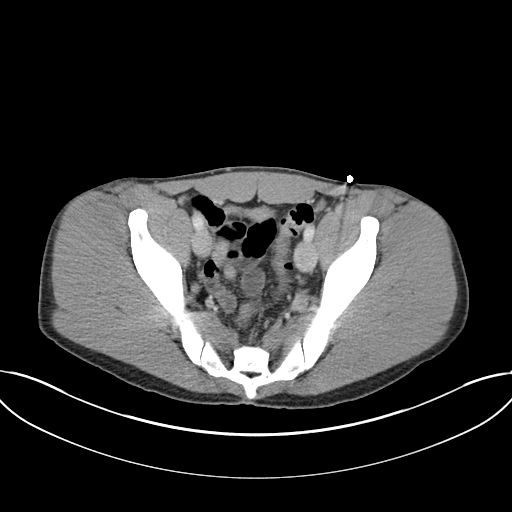
[im 35/93  soft-tissue]
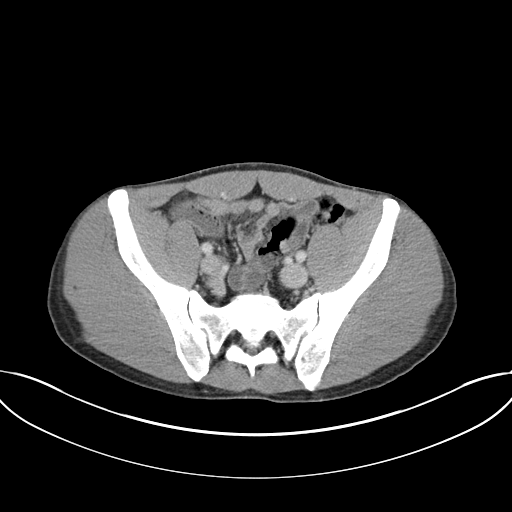
[im 41/93  soft-tissue]
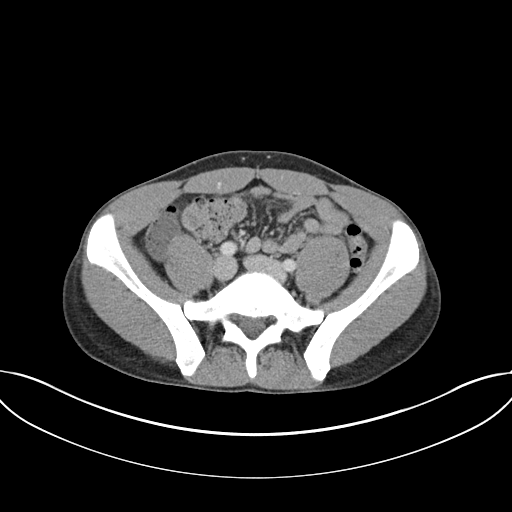
[im 47/93  soft-tissue]
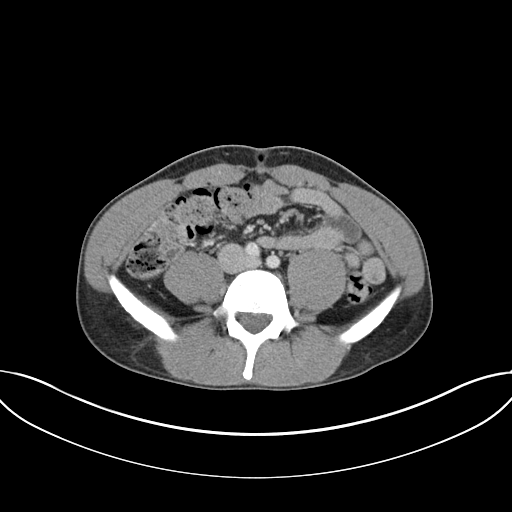
[im 52/93  soft-tissue]
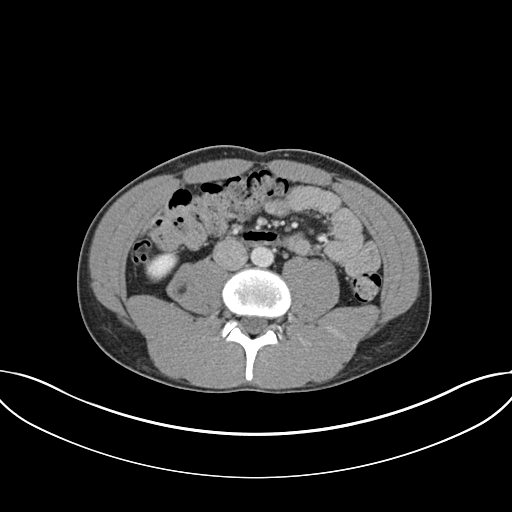
[im 58/93  soft-tissue]
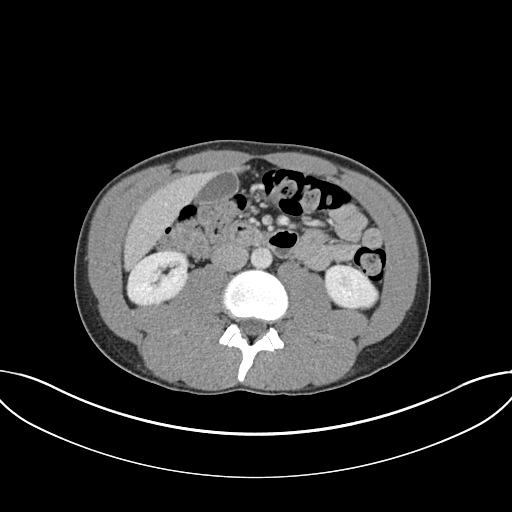
[im 58/93  bone]
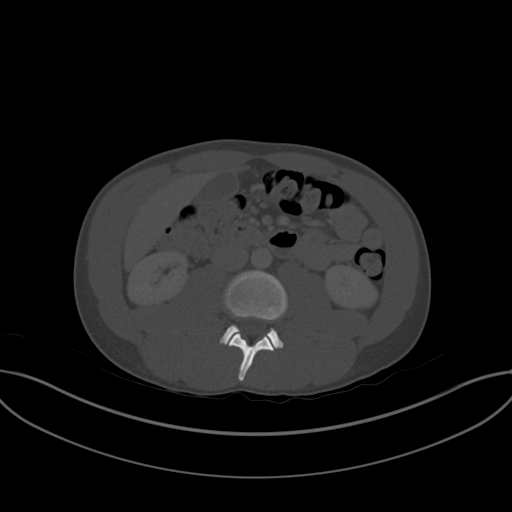
[im 64/93  soft-tissue]
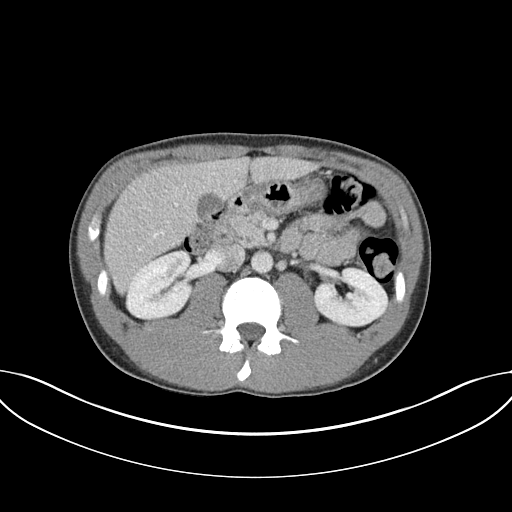
[im 75/93  soft-tissue]
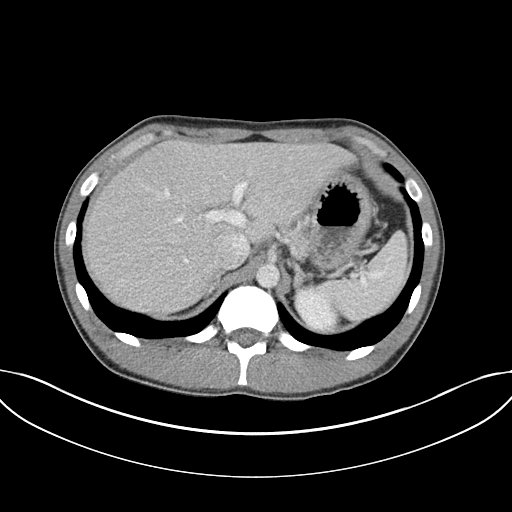
[im 81/93  soft-tissue]
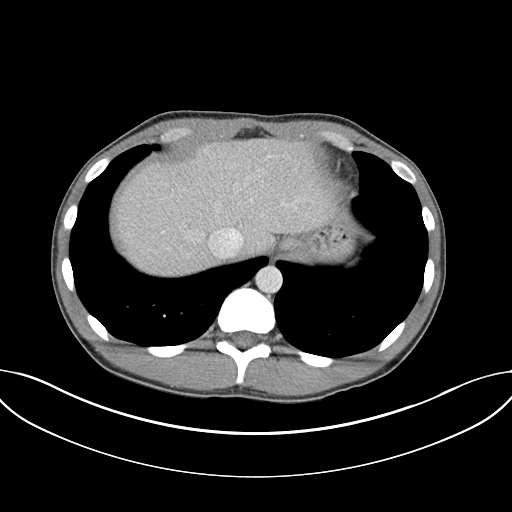
[im 87/93  soft-tissue]
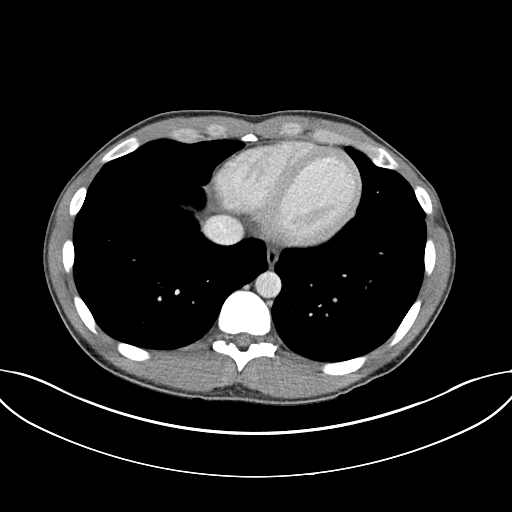

[Series 5: coronal st · coronal · 0.77mm/px · 3 of 125 slices shown]
[im 42/125  soft-tissue]
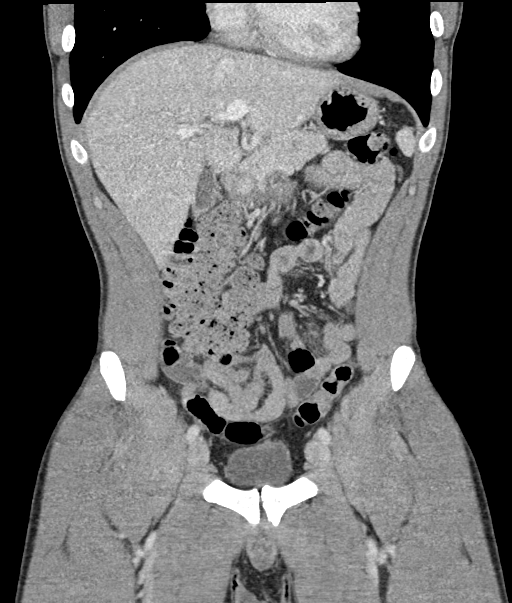
[im 56/125  soft-tissue]
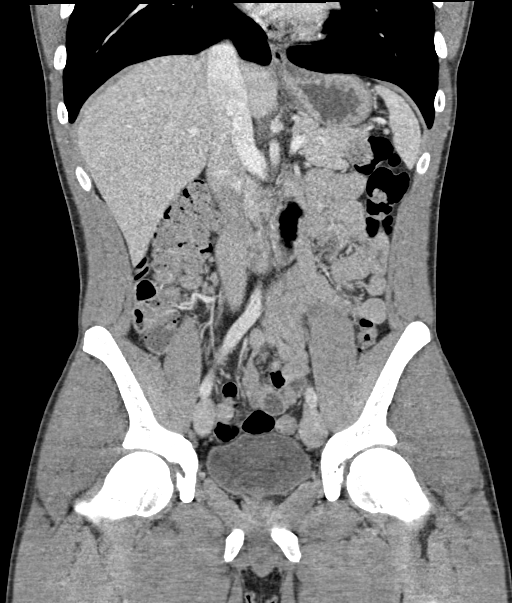
[im 69/125  soft-tissue]
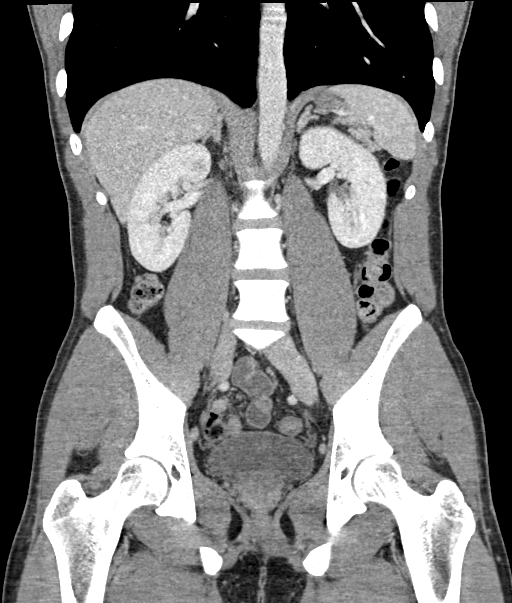

[16 of 46 positions shown; findings below may reference images not displayed]

RADIATION DOSE REDUCTION: This exam was performed according to the
departmental dose-optimization program which includes automated
exposure control, adjustment of the mA and/or kV according to
patient size and/or use of iterative reconstruction technique.

CONTRAST:  100mL OMNIPAQUE IOHEXOL 300 MG/ML  SOLN
FINDINGS: Lower chest: No acute abnormality.

Hepatobiliary: No focal liver abnormality is seen. No gallstones,
gallbladder wall thickening, or biliary dilatation.

Pancreas: Unremarkable. No pancreatic ductal dilatation or
surrounding inflammatory changes.

Spleen: Normal in size without focal abnormality.

Adrenals/Urinary Tract: Adrenal glands are unremarkable. Kidneys are
normal, without renal calculi, focal lesion, or hydronephrosis.
Bladder is unremarkable.

Stomach/Bowel: Stomach is within normal limits. Appendix appears
normal. No evidence of bowel wall thickening, distention, or
inflammatory changes.

Vascular/Lymphatic: No significant vascular findings are present. No
enlarged abdominal or pelvic lymph nodes.

Reproductive: Prostate is unremarkable.

Other: No abdominal wall hernia or abnormality. No abdominopelvic
ascites.

Musculoskeletal: No acute or significant osseous findings.
IMPRESSION: Negative. No CT evidence for acute intra-abdominal or pelvic
abnormality

## 2021-07-30 MED ORDER — PANTOPRAZOLE SODIUM 40 MG IV SOLR
40.0000 mg | Freq: Once | INTRAVENOUS | Status: AC
  Administered 2021-07-30: 40 mg via INTRAVENOUS
  Filled 2021-07-30: qty 10

## 2021-07-30 MED ORDER — MORPHINE SULFATE (PF) 4 MG/ML IV SOLN
4.0000 mg | Freq: Once | INTRAVENOUS | Status: AC
  Administered 2021-07-30: 4 mg via INTRAVENOUS
  Filled 2021-07-30: qty 1

## 2021-07-30 MED ORDER — IOHEXOL 300 MG/ML  SOLN
100.0000 mL | Freq: Once | INTRAMUSCULAR | Status: AC | PRN
  Administered 2021-07-30: 100 mL via INTRAVENOUS

## 2021-07-30 MED ORDER — ONDANSETRON HCL 4 MG/2ML IJ SOLN
4.0000 mg | Freq: Once | INTRAMUSCULAR | Status: AC
  Administered 2021-07-30: 4 mg via INTRAVENOUS
  Filled 2021-07-30: qty 2

## 2021-07-30 MED ORDER — LACTATED RINGERS IV BOLUS
1000.0000 mL | Freq: Once | INTRAVENOUS | Status: AC
  Administered 2021-07-30: 1000 mL via INTRAVENOUS

## 2021-07-30 NOTE — ED Notes (Signed)
Patient transported to CT 

## 2021-07-30 NOTE — Discharge Instructions (Addendum)
Please increase your omeprazole to twice a day.  Please take famotidine (Pepcid AC) twice a day.  Avoid all tobacco and alcohol products.  Use antacids as needed.

## 2021-08-22 ENCOUNTER — Ambulatory Visit (INDEPENDENT_AMBULATORY_CARE_PROVIDER_SITE_OTHER): Payer: 59 | Admitting: Primary Care

## 2022-06-29 ENCOUNTER — Other Ambulatory Visit: Payer: Self-pay

## 2022-06-29 ENCOUNTER — Emergency Department (HOSPITAL_COMMUNITY)
Admission: EM | Admit: 2022-06-29 | Discharge: 2022-06-29 | Disposition: A | Discharge status: Home / Self Care | Payer: Medicaid - Out of State | Attending: Student | Admitting: Student

## 2022-06-29 DIAGNOSIS — R112 Nausea with vomiting, unspecified: Secondary | ICD-10-CM | POA: Insufficient documentation

## 2022-06-29 DIAGNOSIS — R21 Rash and other nonspecific skin eruption: Secondary | ICD-10-CM | POA: Diagnosis not present

## 2022-06-29 DIAGNOSIS — R1013 Epigastric pain: Secondary | ICD-10-CM | POA: Diagnosis not present

## 2022-06-29 DIAGNOSIS — Z87891 Personal history of nicotine dependence: Secondary | ICD-10-CM | POA: Insufficient documentation

## 2022-06-29 DIAGNOSIS — W57XXXA Bitten or stung by nonvenomous insect and other nonvenomous arthropods, initial encounter: Secondary | ICD-10-CM

## 2022-06-29 LAB — URINALYSIS, ROUTINE W REFLEX MICROSCOPIC
Bilirubin Urine: NEGATIVE
Glucose, UA: NEGATIVE mg/dL
Hgb urine dipstick: NEGATIVE
Ketones, ur: NEGATIVE mg/dL
Leukocytes,Ua: NEGATIVE
Nitrite: NEGATIVE
Protein, ur: 30 mg/dL — AB
Specific Gravity, Urine: 1.02 (ref 1.005–1.030)
pH: 8 (ref 5.0–8.0)

## 2022-06-29 LAB — LIPASE, BLOOD: Lipase: 29 U/L (ref 11–51)

## 2022-06-29 LAB — CBC
HCT: 47.9 % (ref 39.0–52.0)
Hemoglobin: 16.3 g/dL (ref 13.0–17.0)
MCH: 30.4 pg (ref 26.0–34.0)
MCHC: 34 g/dL (ref 30.0–36.0)
MCV: 89.2 fL (ref 80.0–100.0)
Platelets: 271 10*3/uL (ref 150–400)
RBC: 5.37 MIL/uL (ref 4.22–5.81)
RDW: 13.7 % (ref 11.5–15.5)
WBC: 13.4 10*3/uL — ABNORMAL HIGH (ref 4.0–10.5)
nRBC: 0 % (ref 0.0–0.2)

## 2022-06-29 LAB — COMPREHENSIVE METABOLIC PANEL
ALT: 25 U/L (ref 0–44)
AST: 26 U/L (ref 15–41)
Albumin: 5 g/dL (ref 3.5–5.0)
Alkaline Phosphatase: 50 U/L (ref 38–126)
Anion gap: 10 (ref 5–15)
BUN: 19 mg/dL (ref 6–20)
CO2: 24 mmol/L (ref 22–32)
Calcium: 9.9 mg/dL (ref 8.9–10.3)
Chloride: 103 mmol/L (ref 98–111)
Creatinine, Ser: 0.97 mg/dL (ref 0.61–1.24)
GFR, Estimated: >60 mL/min (ref >60)
Glucose, Bld: 97 mg/dL (ref 70–99)
Potassium: 3.7 mmol/L (ref 3.5–5.1)
Sodium: 137 mmol/L (ref 135–145)
Total Bilirubin: 1 mg/dL (ref 0.3–1.2)
Total Protein: 8.5 g/dL — ABNORMAL HIGH (ref 6.5–8.1)

## 2022-06-29 MED ORDER — ONDANSETRON HCL 4 MG/2ML IJ SOLN
4.0000 mg | Freq: Once | INTRAMUSCULAR | Status: AC
  Administered 2022-06-29: 4 mg via INTRAVENOUS
  Filled 2022-06-29: qty 2

## 2022-06-29 MED ORDER — DOXYCYCLINE HYCLATE 100 MG PO CAPS: 100.0000 mg | ORAL_CAPSULE | Freq: Two times a day (BID) | ORAL | 0 refills | Status: AC

## 2022-06-29 MED ORDER — ONDANSETRON 4 MG PO TBDP: 4.0000 mg | ORAL_TABLET | Freq: Three times a day (TID) | ORAL | 0 refills | Status: AC | PRN

## 2022-06-29 MED ORDER — LACTATED RINGERS IV BOLUS
1000.0000 mL | Freq: Once | INTRAVENOUS | Status: AC
  Administered 2022-06-29: 1000 mL via INTRAVENOUS

## 2022-06-29 MED ORDER — DROPERIDOL 2.5 MG/ML IJ SOLN
1.2500 mg | Freq: Once | INTRAMUSCULAR | Status: AC
  Administered 2022-06-29: 1.25 mg via INTRAVENOUS
  Filled 2022-06-29: qty 2

## 2022-06-29 MED ORDER — LORAZEPAM 2 MG/ML IJ SOLN
1.0000 mg | Freq: Once | INTRAMUSCULAR | Status: AC
  Administered 2022-06-29: 1 mg via INTRAVENOUS
  Filled 2022-06-29: qty 1

## 2022-06-29 MED ORDER — LORAZEPAM 2 MG/ML IJ SOLN
0.5000 mg | Freq: Once | INTRAMUSCULAR | Status: AC
  Administered 2022-06-29: 0.5 mg via INTRAVENOUS
  Filled 2022-06-29: qty 1

## 2022-06-29 NOTE — ED Notes (Signed)
Patient reporting pain 10/10 in upper abdomen, requesting pain medication. Nurse notified provider.

## 2022-06-29 NOTE — ED Notes (Signed)
Patient resting In bed with eyes closed. Nurse attempted to wake patient. Patient mumbling and unable to open eyes fully and engage in conversation., nurse had educated patient prior to giving medications that these were potential side effects. Also educated that patient would not be able to drive himself from hospital if these effects were experienced. Patient understood and agreed prior to administration. At this time, it is not safe to discharge patient. Per EDP Kommor, patient will be discharged once he is alert/oriented and able to participate in conversation appropriately. Nurse explained this to patient and patient verbalized agreement with plan.

## 2022-06-29 NOTE — ED Provider Notes (Signed)
[place] EMERGENCY DEPARTMENT AT [facility] Provider Note  CSN: [id number] Arrival date & time: 06/29/22 7253  Chief Complaint(s) Emesis and Nausea  HPI Justin Ferrell is a 32 y.o. male with PMH THC use, GERD who presents emergency department for evaluation of multiple complaints including abdominal pain, nausea, vomiting, multiple tick bites and concern for possible hernia.  Patient has multiple previous ER visits for similar complaints in regards to his abdominal pain nausea and vomiting.  He states that he smoked marijuana last night.  States that symptoms began over the last 3 days and pain is primarily in the epigastrium.  He also endorses 2 tick bites over the left lateral chest wall.  He states that he removed the ticks himself and the ticks were not engorged.  Lastly, he states that he has a pain in the right groin that is worse with coughing and sneezing.  No pain at rest and no palpable mass today.   Past Medical History Past Medical History:  Diagnosis Date   GERD (gastroesophageal reflux disease)    Patient Active Problem List   Diagnosis Date Noted   Cannabis hyperemesis syndrome concurrent with and due to cannabis abuse (HCC) 12/22/2019   Shoulder pain 09/17/2016   High risk sexual behavior 08/14/2016   Home Medication(s) Prior to Admission medications   Medication Sig Start Date End Date Taking? Authorizing Provider  alum & mag hydroxide-simeth (MAALOX MAX) 400-400-40 MG/5ML suspension Take 5 mLs by mouth every 6 (six) hours as needed for indigestion. 07/27/21   Horton, Clabe Seal, DO  omeprazole (PRILOSEC) 20 MG capsule Take 1 capsule (20 mg total) by mouth daily. 07/27/21   Horton, Clabe Seal, DO  ondansetron (ZOFRAN) 4 MG tablet Take 1 tablet (4 mg total) by mouth every 8 (eight) hours as needed for nausea or vomiting. 07/27/21   Horton, Clabe Seal, DO  promethazine (PHENERGAN) 25 MG suppository Place 1 suppository (25 mg total) rectally every 6 (six) hours  as needed for nausea or vomiting. 06/04/21   Melene Plan, DO  traZODone (DESYREL) 50 MG tablet TAKE 1 TABLET BY MOUTH EVERYDAY AT BEDTIME 10/23/20   Grayce Sessions, NP  sucralfate (CARAFATE) 1 GM/10ML suspension Take 10 mLs (1 g total) by mouth 4 (four) times daily -  with meals and at bedtime. 06/20/19 09/23/19  Maxwell Caul, PA-C                                                                                                                                    Past Surgical History Past Surgical History:  Procedure Laterality Date   ESOPHAGOGASTRODUODENOSCOPY     Family History Family History  Family history unknown: Yes    Social History Social History   Tobacco Use   Smoking status: Former    Types: Cigars   Smokeless tobacco: Never  Vaping Use   Vaping Use: Never used  Substance Use  Topics   Alcohol use: Never   Drug use: Yes    Types: Marijuana   Allergies Metoclopramide  Review of Systems Review of Systems  Gastrointestinal:  Positive for abdominal pain, nausea and vomiting.  Skin:  Positive for rash.    Physical Exam Vital Signs  I have reviewed the triage vital signs BP (!) 128/90 (BP Location: Right Arm)   Pulse 60   Temp 98 F (36.7 C) (Oral)   Resp 16   Ht 6\' 3"  (1.905 m)   Wt 88.5 kg   SpO2 100%   BMI 24.37 kg/m   Physical Exam Constitutional:      General: He is not in acute distress.    Appearance: Normal appearance.  HENT:     Head: Normocephalic and atraumatic.     Nose: No congestion or rhinorrhea.  Eyes:     General:        Right eye: No discharge.        Left eye: No discharge.     Extraocular Movements: Extraocular movements intact.     Pupils: Pupils are equal, round, and reactive to light.  Cardiovascular:     Rate and Rhythm: Normal rate and regular rhythm.     Heart sounds: No murmur heard. Pulmonary:     Effort: No respiratory distress.     Breath sounds: No wheezing or rales.  Abdominal:     General: There is no  distension.     Tenderness: There is abdominal tenderness.  Musculoskeletal:        General: Normal range of motion.     Cervical back: Normal range of motion.  Skin:    General: Skin is warm and dry.     Findings: Rash present.  Neurological:     General: No focal deficit present.     Mental Status: He is alert.     ED Results and Treatments Labs (all labs ordered are listed, but only abnormal results are displayed) Labs Reviewed  CBC - Abnormal; Notable for the following components:      Result Value   WBC 13.4 (*)    All other components within normal limits  LIPASE, BLOOD  COMPREHENSIVE METABOLIC PANEL  URINALYSIS, ROUTINE W REFLEX MICROSCOPIC                                                                                                                          Radiology No results found.  Pertinent labs & imaging results that were available during my care of the patient were reviewed by me and considered in my medical decision making (see MDM for details).  Medications Ordered in ED Medications - No data to display  Procedures Procedures  (including critical care time)  Medical Decision Making / ED Course   This patient presents to the ED for concern of abdominal pain nausea and vomiting, tick bite, possible hernia, this involves an extensive number of treatment options, and is a complaint that carries with it a high risk of complications and morbidity.  The differential diagnosis includes cannabinoid hyperemesis syndrome, cyclic vomiting, peptic ulcer disease, gastritis, pancreatitis, incarcerated hernia, inguinal hernia, tick bite, bug bite  MDM: Patient seen emergency room for evaluation of multiple complaints described above.  Physical exam with mild epigastric tenderness palpation, mild tenderness in the inguinal canal on the right  but no palpable mass.  Laboratory evaluation unremarkable outside of mild leukocytosis to 13.4.  Urinalysis without evidence of infection.  Patient received droperidol and Ativan and on reevaluation his symptoms have completely resolved.  If patient is seen in the future, patient would benefit from concomitant use of Ativan as the patient did have some akathisia with the droperidol.  Doxycycline sent to patient's pharmacy for possible tick bite exposure.  I had an extended discussion with the patient about THC use and cyclic vomiting syndrome and encouraged cessation.  Patient then discharged with outpatient follow-up.  I placed an outpatient gastroenterology referral.  In regards to the patient's possible hernia, patient does not appear to have a palpable mass and I have low suspicion for incarceration, thus imaging deferred.   Additional history obtained:  -External records from outside source obtained and reviewed including: Chart review including previous notes, labs, imaging, consultation notes   Lab Tests: -I ordered, reviewed, and interpreted labs.   The pertinent results include:   Labs Reviewed  CBC - Abnormal; Notable for the following components:      Result Value   WBC 13.4 (*)    All other components within normal limits  LIPASE, BLOOD  COMPREHENSIVE METABOLIC PANEL  URINALYSIS, ROUTINE W REFLEX MICROSCOPIC      Medicines ordered and prescription drug management: No orders of the defined types were placed in this encounter.   -I have reviewed the patients home medicines and have made adjustments as needed  Critical interventions none     Cardiac Monitoring: The patient was maintained on a cardiac monitor.  I personally viewed and interpreted the cardiac monitored which showed an underlying rhythm of: NSR  Social Determinants of Health:  Factors impacting patients care include: Daily marijuana use   Reevaluation: After the interventions noted above, I  reevaluated the patient and found that they have :improved  Co morbidities that complicate the patient evaluation  Past Medical History:  Diagnosis Date   GERD (gastroesophageal reflux disease)       Dispostion: I considered admission for this patient, but at this time with symptoms resolved he does not meet inpatient criteria for admission he is safe for discharge with outpatient follow-up     Final Clinical Impression(s) / ED Diagnoses Final diagnoses:  None     @PCDICTATION @    Glendora Score, MD 06/29/22 1715

## 2022-06-29 NOTE — ED Notes (Signed)
Patient expresses he is feeling better as far as nausea and is ready to leave. Nurse notified edp

## 2022-06-29 NOTE — ED Notes (Signed)
Patient actively vomiting at 0915. Medication administered. At 0935 patient reports nausea is "easing up"

## 2022-06-29 NOTE — ED Notes (Signed)
Patient awake, alert states he is in no pain, nausea has subsided. Ready for discharged. Edp notified,.

## 2022-06-29 NOTE — ED Triage Notes (Signed)
Pt states for a couple of days he has been nauseated and vomiting. ? Hernia. Has been has several tick bites recently. Pt then states he feels as if it is a virus.

## 2022-06-29 NOTE — ED Notes (Signed)
Patient reports medications have made him jittery. Nurse had to [facility] patient from fluids and monitors so patient could move around. Nurse notified edp
